# Patient Record
Sex: Female | Born: 1965 | Race: White | Hispanic: No | Marital: Married | Smoking: Never smoker
Health system: Southern US, Community
[De-identification: ages and names within clinical notes are randomized; demographics above are authoritative.]

## PROBLEM LIST (undated history)

## (undated) DIAGNOSIS — G43909 Migraine, unspecified, not intractable, without status migrainosus: Secondary | ICD-10-CM

## (undated) DIAGNOSIS — C801 Malignant (primary) neoplasm, unspecified: Secondary | ICD-10-CM

## (undated) DIAGNOSIS — Z923 Personal history of irradiation: Secondary | ICD-10-CM

## (undated) DIAGNOSIS — I341 Nonrheumatic mitral (valve) prolapse: Secondary | ICD-10-CM

## (undated) DIAGNOSIS — J45909 Unspecified asthma, uncomplicated: Secondary | ICD-10-CM

## (undated) HISTORY — PX: OTHER SURGICAL HISTORY: SHX169

## (undated) HISTORY — DX: Nonrheumatic mitral (valve) prolapse: I34.1

## (undated) HISTORY — DX: Migraine, unspecified, not intractable, without status migrainosus: G43.909

## (undated) HISTORY — DX: Unspecified asthma, uncomplicated: J45.909

---

## 1998-07-14 ENCOUNTER — Other Ambulatory Visit: Admission: RE | Admit: 1998-07-14 | Discharge: 1998-07-14 | Payer: Self-pay | Admitting: Gynecology

## 1999-08-17 ENCOUNTER — Inpatient Hospital Stay (HOSPITAL_COMMUNITY): Admission: AD | Admit: 1999-08-17 | Discharge: 1999-08-19 | Payer: Self-pay | Admitting: Gynecology

## 1999-09-22 ENCOUNTER — Other Ambulatory Visit: Admission: RE | Admit: 1999-09-22 | Discharge: 1999-09-22 | Payer: Self-pay | Admitting: Obstetrics and Gynecology

## 2000-09-26 ENCOUNTER — Other Ambulatory Visit: Admission: RE | Admit: 2000-09-26 | Discharge: 2000-09-26 | Payer: Self-pay | Admitting: Obstetrics and Gynecology

## 2000-12-18 ENCOUNTER — Ambulatory Visit (HOSPITAL_COMMUNITY): Admission: RE | Admit: 2000-12-18 | Discharge: 2000-12-18 | Payer: Self-pay | Admitting: Gastroenterology

## 2001-10-11 ENCOUNTER — Other Ambulatory Visit: Admission: RE | Admit: 2001-10-11 | Discharge: 2001-10-11 | Payer: Self-pay | Admitting: Obstetrics and Gynecology

## 2003-07-06 ENCOUNTER — Other Ambulatory Visit: Admission: RE | Admit: 2003-07-06 | Discharge: 2003-07-06 | Payer: Self-pay | Admitting: Obstetrics and Gynecology

## 2003-12-05 HISTORY — PX: DILATION AND CURETTAGE OF UTERUS: SHX78

## 2004-03-09 ENCOUNTER — Ambulatory Visit (HOSPITAL_COMMUNITY): Admission: RE | Admit: 2004-03-09 | Discharge: 2004-03-09 | Payer: Self-pay | Admitting: Obstetrics and Gynecology

## 2004-12-30 ENCOUNTER — Other Ambulatory Visit: Admission: RE | Admit: 2004-12-30 | Discharge: 2004-12-30 | Payer: Self-pay | Admitting: Obstetrics and Gynecology

## 2012-02-23 ENCOUNTER — Other Ambulatory Visit: Payer: Self-pay | Admitting: Obstetrics and Gynecology

## 2012-04-03 ENCOUNTER — Other Ambulatory Visit: Payer: Self-pay | Admitting: Obstetrics and Gynecology

## 2013-01-06 ENCOUNTER — Other Ambulatory Visit: Payer: Self-pay | Admitting: Obstetrics and Gynecology

## 2013-11-21 ENCOUNTER — Encounter (INDEPENDENT_AMBULATORY_CARE_PROVIDER_SITE_OTHER): Payer: Self-pay

## 2013-11-21 ENCOUNTER — Encounter: Payer: Self-pay | Admitting: Interventional Cardiology

## 2013-11-21 ENCOUNTER — Ambulatory Visit (INDEPENDENT_AMBULATORY_CARE_PROVIDER_SITE_OTHER): Payer: BC Managed Care – PPO | Admitting: Interventional Cardiology

## 2013-11-21 VITALS — BP 158/89 | HR 74 | Ht 67.0 in | Wt 122.1 lb

## 2013-11-21 DIAGNOSIS — I059 Rheumatic mitral valve disease, unspecified: Secondary | ICD-10-CM | POA: Insufficient documentation

## 2013-11-21 DIAGNOSIS — Z13220 Encounter for screening for lipoid disorders: Secondary | ICD-10-CM

## 2013-11-21 DIAGNOSIS — R002 Palpitations: Secondary | ICD-10-CM

## 2013-11-21 NOTE — Progress Notes (Signed)
Patient ID: Susan Mueller, female   DOB: Jun 10, 1966, 47 y.o.   MRN: [id number]     Patient ID: Susan Mueller MRN: [id number] DOB/AGE: November 11, 1966 47 y.o.   Referring Physician Dr. Vincente Poli   Reason for Consultation MVP, elevated BP readings  HPI: 47 y/o who was diagnosed with MVP diagnosed about 20 years ago.  Echo at that time was ok.  No SBE prophylaxis.  She has had some intermittent elevated BP readings.  Systolics up to the 150 mm Hg range.  Most readings are below 140 systolic.  She is perimenopausal.  She is very active.  Her job is physical.  She exercises several times a week, but has decreased in the past few months.  She runs as well when the weather is good.    She eats well typically.  She had eaten salty foods prior to some of her BP readings being elevated.    She has had some palpitations as well.  She has had them for years.  While younger, it was at rest.  She feels some skipped beats.  THey have lasted up to 30 seconds.  She feels she is high strung and that is when BP is up.  She has stopped coffee over the past 2 weeks.  She drinks sweet tea for lunch.    Current Outpatient Prescriptions  Medication Sig Dispense Refill  . PROAIR HFA 108 (90 BASE) MCG/ACT inhaler       . PULMICORT FLEXHALER 180 MCG/ACT inhaler        No current facility-administered medications for this visit.   No past medical history on file.  History reviewed. No pertinent family history.  History   Social History  . Marital Status: Married    Spouse Name: N/A    Number of Children: N/A  . Years of Education: N/A   Occupational History  . Not on file.   Social History Main Topics  . Smoking status: Never Smoker   . Smokeless tobacco: Not on file  . Alcohol Use: Yes  . Drug Use: No  . Sexual Activity: Not on file   Other Topics Concern  . Not on file   Social History Narrative  . No narrative on file    No past surgical history on file.    (Not in a hospital  admission)  Review of systems complete and found to be negative unless listed above .  No nausea, vomiting.  No fever chills, No focal weakness,  No palpitations.  Physical Exam: Filed Vitals:   11/21/13 1352  BP: 158/89  Pulse: 74    Weight: 122 lb 1.9 oz (55.393 kg)  Physical exam:  [place]/AT EOMI No JVD, No carotid bruit RRR S1S2  No wheezing Soft. NT, nondistended No edema. No focal motor or sensory deficits Normal affect  Labs:   No results found for this basename: WBC, HGB, HCT, MCV, PLT   No results found for this basename: NA, K, CL, CO2, BUN, CREATININE, CALCIUM, LABALBU, PROT, BILITOT, ALKPHOS, ALT, AST, GLUCOSE,  in the last 168 hours No results found for this basename: CKTOTAL, CKMB, CKMBINDEX, TROPONINI    No results found for this basename: CHOL   No results found for this basename: HDL   No results found for this basename: LDLCALC   No results found for this basename: TRIG   No results found for this basename: CHOLHDL   No results found for this basename: LDLDIRECT      Radiology: EKG:  ASSESSMENT AND PLAN:  MVP:  Plan for echocardiogram. No obvious heart failure symptoms.  Mitral valve prolapse diagnosed many years ago. Palpitations: No syncope. Her symptoms are improving. She'll keep an eye on her symptoms. If things get worse, she will let us know. We could consider a monitor at that time.  Signed:   Fredric Mare, MD, [facility] 11/21/2013, 2:12 PM

## 2013-11-21 NOTE — Patient Instructions (Addendum)
Your physician has requested that you have an echocardiogram. Echocardiography is a painless test that uses sound waves to create images of your heart. It provides your doctor with information about the size and shape of your heart and how well your heart's chambers and valves are working. This procedure takes approximately one hour. There are no restrictions for this procedure.  Your physician recommends that you schedule a follow-up appointment as needed.   Your physician recommends that you return for lab work on 11/24/13 for fasting lipid panel.

## 2013-11-24 ENCOUNTER — Other Ambulatory Visit (INDEPENDENT_AMBULATORY_CARE_PROVIDER_SITE_OTHER): Payer: BC Managed Care – PPO

## 2013-11-24 DIAGNOSIS — Z13220 Encounter for screening for lipoid disorders: Secondary | ICD-10-CM

## 2013-11-24 LAB — LIPID PANEL
HDL: 75.6 mg/dL (ref >39.00)
LDL Cholesterol: 81 mg/dL (ref 0–99)
Total CHOL/HDL Ratio: 2
Triglycerides: 42 mg/dL (ref 0.0–149.0)
VLDL: 8.4 mg/dL (ref 0.0–40.0)

## 2013-11-28 ENCOUNTER — Encounter: Payer: Self-pay | Admitting: Cardiology

## 2013-11-28 ENCOUNTER — Ambulatory Visit (HOSPITAL_COMMUNITY): Payer: BC Managed Care – PPO | Attending: Cardiology | Admitting: Radiology

## 2013-11-28 DIAGNOSIS — I059 Rheumatic mitral valve disease, unspecified: Secondary | ICD-10-CM

## 2013-11-28 DIAGNOSIS — I10 Essential (primary) hypertension: Secondary | ICD-10-CM | POA: Insufficient documentation

## 2013-11-28 DIAGNOSIS — R002 Palpitations: Secondary | ICD-10-CM | POA: Insufficient documentation

## 2013-11-28 DIAGNOSIS — I079 Rheumatic tricuspid valve disease, unspecified: Secondary | ICD-10-CM | POA: Insufficient documentation

## 2013-11-28 NOTE — Progress Notes (Signed)
Echocardiogram performed by Matt LeBeau  

## 2013-12-01 ENCOUNTER — Telehealth: Payer: Self-pay | Admitting: Interventional Cardiology

## 2013-12-01 NOTE — Telephone Encounter (Signed)
Follow Up  ° °Pt returned the call  °

## 2013-12-01 NOTE — Telephone Encounter (Signed)
Returned pt's call.

## 2014-01-26 ENCOUNTER — Other Ambulatory Visit: Payer: Self-pay | Admitting: Obstetrics and Gynecology

## 2015-08-24 DIAGNOSIS — H1010 Acute atopic conjunctivitis, unspecified eye: Secondary | ICD-10-CM | POA: Insufficient documentation

## 2015-08-24 DIAGNOSIS — J309 Allergic rhinitis, unspecified: Principal | ICD-10-CM

## 2015-08-24 DIAGNOSIS — J45909 Unspecified asthma, uncomplicated: Secondary | ICD-10-CM | POA: Insufficient documentation

## 2016-09-04 ENCOUNTER — Ambulatory Visit (INDEPENDENT_AMBULATORY_CARE_PROVIDER_SITE_OTHER): Payer: BC Managed Care – PPO | Admitting: Allergy and Immunology

## 2016-09-04 ENCOUNTER — Encounter: Payer: Self-pay | Admitting: Allergy and Immunology

## 2016-09-04 VITALS — BP 118/70 | HR 60 | Resp 16 | Ht 65.24 in | Wt 125.4 lb

## 2016-09-04 DIAGNOSIS — H1010 Acute atopic conjunctivitis, unspecified eye: Secondary | ICD-10-CM | POA: Diagnosis not present

## 2016-09-04 DIAGNOSIS — J4530 Mild persistent asthma, uncomplicated: Secondary | ICD-10-CM | POA: Diagnosis not present

## 2016-09-04 DIAGNOSIS — J309 Allergic rhinitis, unspecified: Secondary | ICD-10-CM | POA: Diagnosis not present

## 2016-09-04 MED ORDER — ALBUTEROL SULFATE HFA 108 (90 BASE) MCG/ACT IN AERS: 2.0000 | INHALATION_SPRAY | RESPIRATORY_TRACT | 0 refills | Status: AC | PRN

## 2016-09-04 MED ORDER — BUDESONIDE 180 MCG/ACT IN AEPB: 2.0000 | INHALATION_SPRAY | Freq: Two times a day (BID) | RESPIRATORY_TRACT | 3 refills | Status: AC

## 2016-09-04 NOTE — Patient Instructions (Addendum)
  1. Continue Pulmicort 180 - 2 inhalations one or 2 times per day depending on disease activity  2. Continue ProAir HFA 2 puffs every 4-6 hours as needed  3. Continue over-the-counter antihistamine if needed  4. Obtain fall flu vaccine  5. Return to clinic in 1 year or earlier if problem

## 2016-09-04 NOTE — Progress Notes (Signed)
Follow-up Note  Referring Provider: No ref. provider found Primary Provider: Cyril Mourning, MD Date of Office Visit: 09/04/2016  Subjective:   Susan Mueller (DOB: 09-Aug-1966) is a 50 y.o. female who returns to the [facility] on 09/04/2016 in re-evaluation of the following:  HPI: Susan Mueller presents this clinic in evaluation of her asthma and allergic rhinoconjunctivitis. I have not seen her in his clinic since 08/30/2015.  During the interval she has done relatively well. She has not required a systemic steroid to treat an exacerbation. However, she did develop a viral upper respiratory tract infection associated with fever about a month ago and although her upper airway issue has basically resolved she still has a little bit of a lingering cough and chest congestion although certainly every week this is getting better. She did need to use her short-acting bronchodilator during this timeframe but her requirement is now back to 0. She did increase her Pulmicort to twice a day during this timeframe where as she normally uses Pulmicort one time per day.  She is not required a systemic steroids nor has she required a antibiotic to treat her upper and lower airway issues.     Medication List      albuterol 108 (90 Base) MCG/ACT inhaler Commonly known as:  PROAIR HFA Inhale 2 puffs into the lungs every 4 (four) hours as needed for wheezing or shortness of breath.   budesonide 180 MCG/ACT inhaler Commonly known as:  PULMICORT FLEXHALER Inhale 2 puffs into the lungs 2 (two) times daily.   PRESCRIPTION MEDICATION   PRESCRIPTION MEDICATION 1 application daily.       Past Medical History:  Diagnosis Date  . Asthma     Past Surgical History:  Procedure Laterality Date  . arm surgery Left 50 yrs old   broken elbow    Allergies  Allergen Reactions  . Avelox [Moxifloxacin Hcl In Nacl] Other (See Comments)    Numbness in arm  . Codeine   . Septra  [Sulfamethoxazole-Trimethoprim]     Review of systems negative except as noted in HPI / PMHx or noted below:  Review of Systems  Constitutional: Negative.   HENT: Negative.   Eyes: Negative.   Respiratory: Negative.   Cardiovascular: Negative.   Gastrointestinal: Negative.   Genitourinary: Negative.   Musculoskeletal: Negative.   Skin: Negative.   Neurological: Negative.   Endo/Heme/Allergies: Negative.   Psychiatric/Behavioral: Negative.      Objective:   Vitals:   09/04/16 1716  BP: 118/70  Pulse: 60  Resp: 16   Height: 5' 5.24" (165.7 cm)  Weight: 125 lb 6.4 oz (56.9 kg)   Physical Exam  Constitutional: She is well-developed, well-nourished, and in no distress.  HENT:  Head: Normocephalic.  Right Ear: Tympanic membrane, external ear and ear canal normal.  Left Ear: Tympanic membrane, external ear and ear canal normal.  Nose: Nose normal. No mucosal edema or rhinorrhea.  Mouth/Throat: Uvula is midline, oropharynx is clear and moist and mucous membranes are normal. No oropharyngeal exudate.  Eyes: Conjunctivae are normal.  Neck: Trachea normal. No tracheal tenderness present. No tracheal deviation present. No thyromegaly present.  Cardiovascular: Normal rate, regular rhythm, S1 normal, S2 normal and normal heart sounds.   No murmur heard. Pulmonary/Chest: Breath sounds normal. No stridor. No respiratory distress. She has no wheezes. She has no rales.  Musculoskeletal: She exhibits no edema.  Lymphadenopathy:       Head (right side): No tonsillar adenopathy present.  Head (left side): No tonsillar adenopathy present.    She has no cervical adenopathy.  Neurological: She is alert. Gait normal.  Skin: No rash noted. She is not diaphoretic. No erythema. Nails show no clubbing.  Psychiatric: Mood and affect normal.    Diagnostics:    Spirometry was performed and demonstrated an FEV1 of 2.70 at 92 % of predicted.   Assessment and Plan:   1. Mild  persistent asthma without complication   2. Allergic rhinoconjunctivitis     1. Continue Pulmicort 180 - 2 inhalations one or 2 times per day depending on disease activity  2. Continue ProAir HFA 2 puffs every 4-6 hours as needed  3. Continue over-the-counter antihistamine if needed  4. Obtain fall flu vaccine  5. Return to clinic in 1 year or earlier if problem  Susan Mueller appears to be doing relatively well although certainly she appears to have contracted a viral respiratory tract infection that is slowly improving. She is a very good understanding of how her medications work and when it is appropriate to change the dose of Pulmicort. I will see her back in this clinic in 1 year or earlier if there is a problem.  Allena Katz, MD [place]

## 2019-01-06 ENCOUNTER — Other Ambulatory Visit: Payer: Self-pay | Admitting: Obstetrics and Gynecology

## 2019-01-06 DIAGNOSIS — R928 Other abnormal and inconclusive findings on diagnostic imaging of breast: Secondary | ICD-10-CM

## 2019-01-10 ENCOUNTER — Ambulatory Visit
Admission: RE | Admit: 2019-01-10 | Discharge: 2019-01-10 | Disposition: A | Discharge status: Home / Self Care | Payer: BC Managed Care – PPO | Source: Ambulatory Visit | Attending: Obstetrics and Gynecology | Admitting: Obstetrics and Gynecology

## 2019-01-10 ENCOUNTER — Other Ambulatory Visit: Payer: Self-pay | Admitting: Obstetrics and Gynecology

## 2019-01-10 DIAGNOSIS — R928 Other abnormal and inconclusive findings on diagnostic imaging of breast: Secondary | ICD-10-CM

## 2019-01-10 DIAGNOSIS — R921 Mammographic calcification found on diagnostic imaging of breast: Secondary | ICD-10-CM

## 2019-01-15 ENCOUNTER — Ambulatory Visit
Admission: RE | Admit: 2019-01-15 | Discharge: 2019-01-15 | Disposition: A | Discharge status: Home / Self Care | Payer: BC Managed Care – PPO | Source: Ambulatory Visit | Attending: Obstetrics and Gynecology | Admitting: Obstetrics and Gynecology

## 2019-01-15 ENCOUNTER — Other Ambulatory Visit: Payer: Self-pay | Admitting: Obstetrics and Gynecology

## 2019-01-15 DIAGNOSIS — R921 Mammographic calcification found on diagnostic imaging of breast: Secondary | ICD-10-CM

## 2019-01-16 ENCOUNTER — Other Ambulatory Visit: Payer: Self-pay | Admitting: Obstetrics and Gynecology

## 2019-01-16 DIAGNOSIS — C50912 Malignant neoplasm of unspecified site of left female breast: Secondary | ICD-10-CM

## 2019-01-19 ENCOUNTER — Encounter: Payer: Self-pay | Admitting: General Surgery

## 2019-01-20 ENCOUNTER — Encounter: Payer: Self-pay | Admitting: *Deleted

## 2019-01-20 DIAGNOSIS — D0512 Intraductal carcinoma in situ of left breast: Secondary | ICD-10-CM | POA: Insufficient documentation

## 2019-01-21 ENCOUNTER — Ambulatory Visit
Admission: RE | Admit: 2019-01-21 | Discharge: 2019-01-21 | Disposition: A | Discharge status: Home / Self Care | Payer: BC Managed Care – PPO | Source: Ambulatory Visit | Attending: Obstetrics and Gynecology | Admitting: Obstetrics and Gynecology

## 2019-01-21 DIAGNOSIS — C50912 Malignant neoplasm of unspecified site of left female breast: Secondary | ICD-10-CM

## 2019-01-21 NOTE — Progress Notes (Signed)
[facility] CONSULT NOTE  Patient Care Team: Dian Queen, MD as PCP - General (Obstetrics and Gynecology) Fanny Skates, MD as Consulting Physician (General Surgery) Nicholas Lose, MD as Consulting Physician (Hematology and Oncology) Eppie Gibson, MD as Attending Physician (Radiation Oncology) Rockwell Germany, RN as Oncology Nurse Navigator Mauro Kaufmann, RN as Oncology Nurse Navigator  CHIEF COMPLAINTS/PURPOSE OF CONSULTATION: Newly diagnosed breast cancer  HISTORY OF PRESENTING ILLNESS:  Susan Mueller 53 y.o. female is here because of recent diagnosis of DCIS of the left breast. The cancer was detected on a routine diagnostic mammogram on 01/10/19 which showed calcifications in the left breast. A biopsy on 01/15/19 showed the 0.8cm cancer in the upper outer quadrant to be high grade DCIS with calcifications, ER 80%, PR negative. A biopsy done on 01/21/19 showed no evidence of malignancy in the upper inner quadrant of the left breast.   She presents to the clinic today with her husband. She denies any family history of breast cancer. She has taken estrogen and progesterone for 2 years for menopausal symptoms and slightly elevated high blood pressure. She also uses vaginal estrogen cream for vaginal dryness. She works in [place], [place] and is interested in radiation closer to her work there.   I reviewed her records extensively and collaborated the history with the patient.  SUMMARY OF ONCOLOGIC HISTORY:   Ductal carcinoma in situ (DCIS) of left breast   01/15/2019 Initial Diagnosis    Screening detected left breast calcifications UOQ 8 mm: Biopsy revealed high-grade DCIS with calcifications and necrosis ER 80% strong positive, PR 0%; second group of calcifications medially?  Oil cyst biopsy pending; Tis NX stage 0     MEDICAL HISTORY:  Past Medical History:  Diagnosis Date  . Asthma   . Migraines   . Mitral valve prolapse     SURGICAL HISTORY: Past  Surgical History:  Procedure Laterality Date  . arm surgery Left 53 yrs old   broken elbow    SOCIAL HISTORY: Social History   Socioeconomic History  . Marital status: Married    Spouse name: Not on file  . Number of children: Not on file  . Years of education: Not on file  . Highest education level: Not on file  Occupational History  . Not on file  Social Needs  . Financial resource strain: Not on file  . Food insecurity:    Worry: Not on file    Inability: Not on file  . Transportation needs:    Medical: Not on file    Non-medical: Not on file  Tobacco Use  . Smoking status: Never Smoker  Substance and Sexual Activity  . Alcohol use: Yes  . Drug use: No  . Sexual activity: Not on file  Lifestyle  . Physical activity:    Days per week: Not on file    Minutes per session: Not on file  . Stress: Not on file  Relationships  . Social connections:    Talks on phone: Not on file    Gets together: Not on file    Attends religious service: Not on file    Active member of club or organization: Not on file    Attends meetings of clubs or organizations: Not on file    Relationship status: Not on file  . Intimate partner violence:    Fear of current or ex partner: Not on file    Emotionally abused: Not on file    Physically abused:  Not on file    Forced sexual activity: Not on file  Other Topics Concern  . Not on file  Social History Narrative  . Not on file    FAMILY HISTORY: Family History  Problem Relation Age of Onset  . Hypercholesterolemia Sister        elevated triglycerides    ALLERGIES:  is allergic to avelox [moxifloxacin hcl in nacl]; codeine; and septra [sulfamethoxazole-trimethoprim].  MEDICATIONS:  Current Outpatient Medications  Medication Sig Dispense Refill  . albuterol (PROAIR HFA) 108 (90 Base) MCG/ACT inhaler Inhale 2 puffs into the lungs every 4 (four) hours as needed for wheezing or shortness of breath. 1 Inhaler 0  . Multiple  Vitamins-Minerals (MULTIVITAMIN WITH MINERALS) tablet Take 1 tablet by mouth daily.    Marland Kitchen PRESCRIPTION MEDICATION 1 application daily.    . budesonide (PULMICORT FLEXHALER) 180 MCG/ACT inhaler Inhale 2 puffs into the lungs 2 (two) times daily. (Patient not taking: Reported on 01/22/2019) 1 Inhaler 3  . PRESCRIPTION MEDICATION      No current facility-administered medications for this visit.     REVIEW OF SYSTEMS:   Constitutional: Denies fevers, chills or abnormal night sweats Eyes: Denies blurriness of vision, double vision or watery eyes Ears, nose, mouth, throat, and face: Denies mucositis or sore throat Respiratory: Denies cough, dyspnea or wheezes Cardiovascular: Denies palpitation, chest discomfort or lower extremity swelling Gastrointestinal:  Denies nausea, heartburn or change in bowel habits Skin: Denies abnormal skin rashes Lymphatics: Denies new lymphadenopathy or easy bruising Neurological:Denies numbness, tingling or new weaknesses Behavioral/Psych: Mood is stable, no new changes  Breast: Denies any palpable lumps or discharge All other systems were reviewed with the patient and are negative.  PHYSICAL EXAMINATION: ECOG PERFORMANCE STATUS: 1 - Symptomatic but completely ambulatory  Vitals:   01/22/19 1239  BP: (!) 153/85  Pulse: 77  Resp: 20  Temp: 98.7 F (37.1 C)  SpO2: 100%   Filed Weights   01/22/19 1239  Weight: 127 lb 9.6 oz (57.9 kg)    GENERAL:alert, no distress and comfortable SKIN: skin color, texture, turgor are normal, no rashes or significant lesions EYES: normal, conjunctiva are pink and non-injected, sclera clear OROPHARYNX:no exudate, no erythema and lips, buccal mucosa, and tongue normal  NECK: supple, thyroid normal size, non-tender, without nodularity LYMPH:  no palpable lymphadenopathy in the cervical, axillary or inguinal LUNGS: clear to auscultation and percussion with normal breathing effort HEART: regular rate & rhythm and no murmurs  and no lower extremity edema ABDOMEN:abdomen soft, non-tender and normal bowel sounds Musculoskeletal:no cyanosis of digits and no clubbing  PSYCH: alert & oriented x 3 with fluent speech NEURO: no focal motor/sensory deficits  LABORATORY DATA:  I have reviewed the data as listed Lab Results  Component Value Date   WBC 6.4 01/22/2019   HGB 13.7 01/22/2019   HCT 39.4 01/22/2019   MCV 92.1 01/22/2019   PLT 208 01/22/2019   Lab Results  Component Value Date   NA 143 01/22/2019   K 3.8 01/22/2019   CL 105 01/22/2019   CO2 30 01/22/2019    RADIOGRAPHIC STUDIES: I have personally reviewed the radiological reports and agreed with the findings in the report.  ASSESSMENT AND PLAN:  Ductal carcinoma in situ (DCIS) of left breast 01/15/2019:Screening detected left breast calcifications UOQ 8 mm: Biopsy revealed high-grade DCIS with calcifications and necrosis ER 80% strong positive, PR 0%; second group of calcifications medially?  Oil cyst biopsy pending; Tis NX stage 0  Pathology review: I  discussed with the patient the difference between DCIS and invasive breast cancer. It is considered a precancerous lesion. DCIS is classified as a 0. It is generally detected through mammograms as calcifications. We discussed the significance of grades and its impact on prognosis. We also discussed the importance of ER and PR receptors and their implications to adjuvant treatment options. Prognosis of DCIS dependence on grade, comedo necrosis. It is anticipated that if not treated, 20-30% of DCIS can develop into invasive breast cancer.  Recommendation: 1. Breast conserving surgery 2. Followed by adjuvant radiation therapy 3. Followed by antiestrogen therapy with tamoxifen 5 years  Tamoxifen counseling: We discussed the risks and benefits of tamoxifen. These include but not limited to insomnia, hot flashes, mood changes, vaginal dryness, and weight gain. Although rare, serious side effects including  endometrial cancer, risk of blood clots were also discussed. We strongly believe that the benefits far outweigh the risks. Patient understands these risks and consented to starting treatment. Planned treatment duration is 5 years.  Return to clinic after surgery to discuss the final pathology report and come up with an adjuvant treatment plan.     All questions were answered. The patient knows to call the clinic with any problems, questions or concerns.   Nicholas Lose, MD 01/22/2019   I, Cloyde Reams Dorshimer, am acting as scribe for Nicholas Lose, MD.  I have reviewed the above documentation for accuracy and completeness, and I agree with the above.

## 2019-01-22 ENCOUNTER — Inpatient Hospital Stay: Payer: BC Managed Care – PPO

## 2019-01-22 ENCOUNTER — Encounter: Payer: Self-pay | Admitting: Hematology and Oncology

## 2019-01-22 ENCOUNTER — Other Ambulatory Visit: Payer: Self-pay | Admitting: General Surgery

## 2019-01-22 ENCOUNTER — Ambulatory Visit: Payer: BC Managed Care – PPO | Admitting: Physical Therapy

## 2019-01-22 ENCOUNTER — Encounter: Payer: Self-pay | Admitting: Radiation Oncology

## 2019-01-22 ENCOUNTER — Inpatient Hospital Stay: Payer: BC Managed Care – PPO | Attending: Hematology and Oncology | Admitting: Hematology and Oncology

## 2019-01-22 ENCOUNTER — Other Ambulatory Visit: Payer: Self-pay | Admitting: Genetics

## 2019-01-22 ENCOUNTER — Ambulatory Visit
Admission: RE | Admit: 2019-01-22 | Discharge: 2019-01-22 | Disposition: A | Discharge status: Home / Self Care | Payer: BC Managed Care – PPO | Source: Ambulatory Visit | Attending: Radiation Oncology | Admitting: Radiation Oncology

## 2019-01-22 DIAGNOSIS — J45909 Unspecified asthma, uncomplicated: Secondary | ICD-10-CM | POA: Diagnosis not present

## 2019-01-22 DIAGNOSIS — D0512 Intraductal carcinoma in situ of left breast: Secondary | ICD-10-CM

## 2019-01-22 DIAGNOSIS — I341 Nonrheumatic mitral (valve) prolapse: Secondary | ICD-10-CM | POA: Diagnosis not present

## 2019-01-22 DIAGNOSIS — C50412 Malignant neoplasm of upper-outer quadrant of left female breast: Secondary | ICD-10-CM

## 2019-01-22 DIAGNOSIS — Z79899 Other long term (current) drug therapy: Secondary | ICD-10-CM | POA: Diagnosis not present

## 2019-01-22 DIAGNOSIS — Z170 Estrogen receptor positive status [ER+]: Secondary | ICD-10-CM | POA: Diagnosis not present

## 2019-01-22 LAB — CBC WITH DIFFERENTIAL (CANCER CENTER ONLY)
Abs Immature Granulocytes: 0.01 10*3/uL (ref 0.00–0.07)
Basophils Absolute: 0 10*3/uL (ref 0.0–0.1)
Basophils Relative: 1 %
EOS ABS: 0 10*3/uL (ref 0.0–0.5)
Eosinophils Relative: 1 %
HCT: 39.4 % (ref 36.0–46.0)
Hemoglobin: 13.7 g/dL (ref 12.0–15.0)
Immature Granulocytes: 0 %
Lymphocytes Relative: 35 %
Lymphs Abs: 2.2 10*3/uL (ref 0.7–4.0)
MCH: 32 pg (ref 26.0–34.0)
MCHC: 34.8 g/dL (ref 30.0–36.0)
MCV: 92.1 fL (ref 80.0–100.0)
Monocytes Absolute: 0.3 10*3/uL (ref 0.1–1.0)
Monocytes Relative: 5 %
Neutro Abs: 3.8 10*3/uL (ref 1.7–7.7)
Neutrophils Relative %: 58 %
Platelet Count: 208 10*3/uL (ref 150–400)
RBC: 4.28 MIL/uL (ref 3.87–5.11)
RDW: 11.6 % (ref 11.5–15.5)
WBC Count: 6.4 10*3/uL (ref 4.0–10.5)
nRBC: 0 % (ref 0.0–0.2)

## 2019-01-22 LAB — CMP (CANCER CENTER ONLY)
ALK PHOS: 48 U/L (ref 38–126)
ALT: 14 U/L (ref 0–44)
AST: 18 U/L (ref 15–41)
Albumin: 4.5 g/dL (ref 3.5–5.0)
Anion gap: 8 (ref 5–15)
BUN: 12 mg/dL (ref 6–20)
CALCIUM: 9.4 mg/dL (ref 8.9–10.3)
CO2: 30 mmol/L (ref 22–32)
Chloride: 105 mmol/L (ref 98–111)
Creatinine: 0.86 mg/dL (ref 0.44–1.00)
GFR, Est AFR Am: >60 mL/min (ref >60)
GFR, Estimated: >60 mL/min (ref >60)
Glucose, Bld: 133 mg/dL — ABNORMAL HIGH (ref 70–99)
Potassium: 3.8 mmol/L (ref 3.5–5.1)
Sodium: 143 mmol/L (ref 135–145)
Total Bilirubin: 0.5 mg/dL (ref 0.3–1.2)
Total Protein: 7.1 g/dL (ref 6.5–8.1)

## 2019-01-22 NOTE — Assessment & Plan Note (Signed)
01/15/2019:Screening detected left breast calcifications UOQ 8 mm: Biopsy revealed high-grade DCIS with calcifications and necrosis ER 80% strong positive, PR 0%; second group of calcifications medially?  Oil cyst biopsy pending; Tis NX stage 0  Pathology review: I discussed with the patient the difference between DCIS and invasive breast cancer. It is considered a precancerous lesion. DCIS is classified as a 0. It is generally detected through mammograms as calcifications. We discussed the significance of grades and its impact on prognosis. We also discussed the importance of ER and PR receptors and their implications to adjuvant treatment options. Prognosis of DCIS dependence on grade, comedo necrosis. It is anticipated that if not treated, 20-30% of DCIS can develop into invasive breast cancer.  Recommendation: 1. Breast conserving surgery 2. Followed by adjuvant radiation therapy 3. Followed by antiestrogen therapy with tamoxifen 5 years  Tamoxifen counseling: We discussed the risks and benefits of tamoxifen. These include but not limited to insomnia, hot flashes, mood changes, vaginal dryness, and weight gain. Although rare, serious side effects including endometrial cancer, risk of blood clots were also discussed. We strongly believe that the benefits far outweigh the risks. Patient understands these risks and consented to starting treatment. Planned treatment duration is 5 years.  Return to clinic after surgery to discuss the final pathology report and come up with an adjuvant treatment plan.

## 2019-01-22 NOTE — Progress Notes (Signed)
Radiation Oncology         [phone]) [phone] ________________________________  Initial outpatient Consultation  Name: DIANELYS SCINTO MRN: [id number]  Date: 01/22/2019  DOB: 08/29/1966  KG:YJEHUD, Sharyn Lull, MD  Fanny Skates, MD   REFERRING PHYSICIAN: Fanny Skates, MD  DIAGNOSIS:    ICD-10-CM   1. Ductal carcinoma in situ (DCIS) of left breast D05.12      Cancer Staging Ductal carcinoma in situ (DCIS) of left breast Staging form: Breast, AJCC 8th Edition - Clinical stage from 01/22/2019: Stage 0 (cTis (DCIS), cN0, cM0, ER+, PR-, HER2: Not Assessed) - Unsigned   CHIEF COMPLAINT: Here to discuss management of left breast DCIS  HISTORY OF PRESENT ILLNESS::Sherryl C Kasal is a 53 y.o. female who presented with breast abnormality on the following imaging: Mammography which showed an upper outer quadrant 8 mm region of left breast calcifications.  There is also an area of calcifications more medially consistent with an oil cyst.  Symptoms, if any, at that time, were: None.  Biopsy of calcifications left upper outer quadrant revealed high-grade DCIS with necrosis, ER positive PR negative.  Second biopsy at the area of the presumed oil cyst revealed fibrocystic change and fat necrosis with calcifications, no malignancy  The patient is in her usual state of health otherwise.  She was just instructed to stop her hormonal supplementation.  She does report baseline hearing loss and ringing in her ears as well as palpitations and mitral valve prolapse.  She has a history of a pre-cancerous lesion removed from her skin as well as she reports headaches.  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  PREVIOUS RADIATION THERAPY: No  PAST MEDICAL HISTORY:  has a past medical history of Asthma, Migraines, and Mitral valve prolapse.    PAST SURGICAL HISTORY: Past Surgical History:  Procedure Laterality Date  . arm surgery Left 53 yrs old   broken elbow    FAMILY  HISTORY: family history includes Hypercholesterolemia in her sister.  SOCIAL HISTORY:  reports that she has never smoked. She does not have any smokeless tobacco history on file. She reports current alcohol use. She reports that she does not use drugs.  ALLERGIES: Avelox [moxifloxacin hcl in nacl]; Codeine; and Septra [sulfamethoxazole-trimethoprim]  MEDICATIONS:  Current Outpatient Medications  Medication Sig Dispense Refill  . albuterol (PROAIR HFA) 108 (90 Base) MCG/ACT inhaler Inhale 2 puffs into the lungs every 4 (four) hours as needed for wheezing or shortness of breath. 1 Inhaler 0  . budesonide (PULMICORT FLEXHALER) 180 MCG/ACT inhaler Inhale 2 puffs into the lungs 2 (two) times daily. (Patient not taking: Reported on 01/22/2019) 1 Inhaler 3  . Multiple Vitamins-Minerals (MULTIVITAMIN WITH MINERALS) tablet Take 1 tablet by mouth daily.    Marland Kitchen PRESCRIPTION MEDICATION     . PRESCRIPTION MEDICATION 1 application daily.     No current facility-administered medications for this encounter.     REVIEW OF SYSTEMS: A 10+ POINT REVIEW OF SYSTEMS WAS OBTAINED including neurology, dermatology, psychiatry, cardiac, respiratory, lymph, extremities, GI, GU, Musculoskeletal, constitutional, breasts, reproductive, HEENT.  All pertinent positives are noted in the HPI.  All others are negative.   PHYSICAL EXAM:   Vitals with Age-Percentiles 01/22/2019  Length 149.7 cm  Systolic 026  Diastolic 85  Pulse 77  Respiration 20  Weight 57.879 kg  BMI 20.61  VISIT REPORT    General: Alert and oriented, in no acute distress HEENT: Head is normocephalic. Extraocular movements are intact. Oropharynx is clear. Neck:  Neck is supple, no palpable cervical or supraclavicular lymphadenopathy. Heart: Regular in rate and rhythm with no murmurs, rubs, or gallops. Chest: Clear to auscultation bilaterally, with no rhonchi, wheezes, or rales. Abdomen: Soft, nontender, nondistended, with no rigidity or  guarding. Extremities: No cyanosis or edema. Lymphatics: see Neck Exam Skin: No concerning lesions. Musculoskeletal: symmetric strength and muscle tone throughout. Neurologic: Cranial nerves II through XII are grossly intact. No obvious focalities. Speech is fluent. Coordination is intact. Psychiatric: Judgment and insight are intact. Affect is appropriate. Breasts: There is hardening in the upper left breast related to recent biopsies.. No other palpable masses appreciated in the breasts or axillae bilaterally    ECOG = 0  0 - Asymptomatic (Fully active, able to carry on all predisease activities without restriction)  1 - Symptomatic but completely ambulatory (Restricted in physically strenuous activity but ambulatory and able to carry out work of a light or sedentary nature. For example, light housework, office work)  2 - Symptomatic, <50% in bed during the day (Ambulatory and capable of all self care but unable to carry out any work activities. Up and about more than 50% of waking hours)  3 - Symptomatic, >50% in bed, but not bedbound (Capable of only limited self-care, confined to bed or chair 50% or more of waking hours)  4 - Bedbound (Completely disabled. Cannot carry on any self-care. Totally confined to bed or chair)  5 - Death   Eustace Pen MM, Creech RH, Tormey DC, et al. [phone]). "Toxicity and response criteria of the [facility]". [place] Oncol. 5 (6): 649-55   LABORATORY DATA:  Lab Results  Component Value Date   WBC 6.4 01/22/2019   HGB 13.7 01/22/2019   HCT 39.4 01/22/2019   MCV 92.1 01/22/2019   PLT 208 01/22/2019   CMP     Component Value Date/Time   NA 143 01/22/2019 1144   K 3.8 01/22/2019 1144   CL 105 01/22/2019 1144   CO2 30 01/22/2019 1144   GLUCOSE 133 (H) 01/22/2019 1144   BUN 12 01/22/2019 1144   CREATININE 0.86 01/22/2019 1144   CALCIUM 9.4 01/22/2019 1144   PROT 7.1 01/22/2019 1144   ALBUMIN 4.5 01/22/2019 1144   AST 18  01/22/2019 1144   ALT 14 01/22/2019 1144   ALKPHOS 48 01/22/2019 1144   BILITOT 0.5 01/22/2019 1144   GFRNONAA >60 01/22/2019 1144   GFRAA >60 01/22/2019 1144         RADIOGRAPHY: Mm Digital Diagnostic Unilat L  Result Date: 01/10/2019 CLINICAL DATA:  Patient was called back for left breast calcifications EXAM: DIGITAL DIAGNOSTIC LEFT MAMMOGRAM COMPARISON:  Previous exam(s). ACR Breast Density Category c: The breast tissue is heterogeneously dense, which may obscure small masses. FINDINGS: Calcifications in the upper-outer left breast spanning 8 mm are primarily round and punctate. They represent a change since previous mammography. A second group of calcifications in the medial superior left breast are likely a developing oil cyst. IMPRESSION: Indeterminate new left breast calcifications in the upper outer quadrant. Probable developing oil cyst with rim calcifications in the medial superior left breast. RECOMMENDATION: Recommend stereotactic biopsy of the new left breast calcifications in the upper outer quadrant. If the biopsy is benign, recommend six-month follow-up of the probable developing oil cyst. If the biopsy require surgery, consider biopsying the calcifications thought to be a developing oil cysts for confirmation. I have discussed the findings and recommendations with the patient. Results were also provided in writing at  the conclusion of the visit. If applicable, a reminder letter will be sent to the patient regarding the next appointment. BI-RADS CATEGORY  4: Suspicious. Electronically Signed   By: Dorise Bullion III M.D   On: 01/10/2019 12:23   Mm Clip Placement Left  Result Date: 01/21/2019 CLINICAL DATA:  53 year old female status post stereotactic biopsy of left breast calcifications. EXAM: DIAGNOSTIC LEFT MAMMOGRAM POST STEREOTACTIC BIOPSY COMPARISON:  Previous exam(s). FINDINGS: Mammographic images were obtained following stereotactic guided biopsy of the left breast. An X shaped  clip is identified in the lower inner quadrant at middle depth. There is approximately 1.5 cm inferior migration of the clip relative to the biopsy site. An additional coarse calcification is seen adjacent to the biopsied location. A coil shaped clip in the upper outer quadrant denotes the site of the patient's biopsy-proven high-grade invasive ductal carcinoma in situ. IMPRESSION: Approximately 1.5 cm inferior migration of the X shaped clip relative to the site of biopsy calcifications. An additional course calcification at the site of biopsy can be used for localization purposes. Final Assessment: Post Procedure Mammograms for Marker Placement Electronically Signed   By: Kristopher Oppenheim M.D.   On: 01/21/2019 09:27   Mm Clip Placement Left  Result Date: 01/15/2019 CLINICAL DATA:  Status post stereotactic guided core biopsy of calcifications in the UPPER-OUTER QUADRANT of the LEFT breast. EXAM: DIAGNOSTIC LEFT MAMMOGRAM POST ULTRASOUND BIOPSY COMPARISON:  Previous exam(s). FINDINGS: Mammographic images were obtained following stereotactic guided biopsy of calcifications in the UPPER-OUTER QUADRANT of the LEFT breast. A coil shaped clip is identified in the UPPER-OUTER QUADRANT of the RIGHT breast, adjacent to residual calcifications. Other scattered benign calcifications in the LEFT breast are stable. IMPRESSION: Tissue marker clip in the expected location following biopsy. Final Assessment: Post Procedure Mammograms for Marker Placement Electronically Signed   By: Nolon Nations M.D.   On: 01/15/2019 14:54   Mm Lt Breast Bx W Loc Dev 1st Lesion Image Bx Spec Stereo Guide  Addendum Date: 01/22/2019   ADDENDUM REPORT: 01/22/2019 12:02 ADDENDUM: Pathology revealed FIBROCYSTIC CHANGE AND FAT NECROSIS WITH CALCIFICATIONS of the upper inner quadrant, middle. This was found to be concordant by Dr. Kristopher Oppenheim. Pathology results were discussed with the patient by telephone. The patient reported doing well after  the biopsy with tenderness at the site. Post biopsy instructions and care were reviewed and questions were answered. The patient was encouraged to call [facility] for any additional concerns. The patient has a recent diagnosis of left breast cancer and should follow her outlined treatment plan. The patient was referred to [facility] on January 22, 2019. Pathology results reported by Terie Purser, RN on 01/22/2019. Electronically Signed   By: Kristopher Oppenheim M.D.   On: 01/22/2019 12:02   Result Date: 01/22/2019 CLINICAL DATA:  53 year old female with biopsy-proven high-grade DCIS in a left breast presents for additional biopsy of indeterminate left breast calcifications in the upper inner quadrant. EXAM: LEFT BREAST STEREOTACTIC CORE NEEDLE BIOPSY COMPARISON:  Previous exams. FINDINGS: The patient and I discussed the procedure of stereotactic-guided biopsy including benefits and alternatives. We discussed the high likelihood of a successful procedure. We discussed the risks of the procedure including infection, bleeding, tissue injury, clip migration, and inadequate sampling. Informed written consent was given. The usual time out protocol was performed immediately prior to the procedure. Using sterile technique and 1% Lidocaine as local anesthetic, under stereotactic guidance,  a 9 gauge vacuum assisted device was used to perform core needle biopsy of calcifications in the upper inner quadrant of the left breast at middle depth using a superior approach. Specimen radiograph was performed showing calcifications in all specimens. Specimens with calcifications are identified for pathology. Lesion quadrant: Upper inner quadrant At the conclusion of the procedure, a X shaped tissue marker clip was deployed into the biopsy cavity. Follow-up 2-view mammogram was performed and dictated separately. IMPRESSION:  Stereotactic-guided biopsy of indeterminate left breast calcifications. No apparent complications. Electronically Signed: By: Kristopher Oppenheim M.D. On: 01/21/2019 09:26   Mm Lt Breast Bx W Loc Dev 1st Lesion Image Bx Spec Stereo Guide  Addendum Date: 01/20/2019   ADDENDUM REPORT: 01/17/2019 08:19 ADDENDUM: Pathology revealed HIGH GRADE DUCTAL CARCINOMA IN SITU WITH CALCIFICATIONS of the Left breast, outer, (coil clip). This was found to be concordant by Dr. Nolon Nations. Pathology results were discussed with the patient by telephone. The patient reported doing well after the biopsy with tenderness at the site. Post biopsy instructions and care were reviewed and questions were answered. The patient was encouraged to call [facility] for any additional concerns. The patient was referred to [facility] on January 22, 2019. The patient is scheduled for an additional Left breast stereotatic guided biopsy at [facility] on January 21, 2019. Pathology results reported by Terie Purser, RN on 01/17/2019. Electronically Signed   By: Nolon Nations M.D.   On: 01/17/2019 08:19   Result Date: 01/20/2019 CLINICAL DATA:  Status post stereotactic guided biopsy of calcifications in the UPPER-OUTER QUADRANT the LEFT. EXAM: LEFT BREAST STEREOTACTIC CORE NEEDLE BIOPSY COMPARISON:  Previous exams. FINDINGS: The patient and I discussed the procedure of stereotactic-guided biopsy including benefits and alternatives. We discussed the high likelihood of a successful procedure. We discussed the risks of the procedure including infection, bleeding, tissue injury, clip migration, and inadequate sampling. Informed written consent was given. The usual time out protocol was performed immediately prior to the procedure. Using sterile technique and 1% Lidocaine as local anesthetic, under stereotactic guidance, a 9 gauge vacuum  assisted device was used to perform core needle biopsy of calcifications in the UPPER-OUTER QUADRANT of the LEFT breast using a superior to inferior approach. Specimen radiograph was performed showing calcifications numerous ankle. Specimens with calcifications are identified for pathology. Lesion quadrant:   UPPER-OUTER QUADRANT LEFT breast At the conclusion of the procedure, a coil shaped tissue marker clip was deployed into the biopsy cavity. Follow-up 2-view mammogram was performed and dictated separately. IMPRESSION: Stereotactic-guided biopsy of LEFT breast calcifications. No apparent complications. Electronically Signed: By: Nolon Nations M.D. On: 01/15/2019 14:53      IMPRESSION/PLAN: Left breast DCIS  I personally reviewed her imaging at tumor board.  She has been discussed at our multidisciplinary tumor board.  The consensus is that she would be a good candidate for breast conservation. She is enthusiastic about breast conservation.  It was a pleasure meeting the patient today. We discussed the risks, benefits, and side effects of radiotherapy. I recommend radiotherapy to the left breast to reduce her risk of locoregional recurrence by half. We discussed that radiation would take approximately 4 weeks to complete and that I would give the patient a few weeks to heal following surgery before starting treatment planning.  We spoke about acute effects including skin irritation and fatigue as well as much less common late effects including  internal organ injury or irritation. We spoke about the latest technology that is used to minimize the risk of late effects for patients undergoing radiotherapy to the breast or chest wall. No guarantees of treatment were given. The patient is enthusiastic about proceeding with treatment. I look forward to participating in the patient's care.  I will await her referral back to me for postoperative follow-up and eventual CT simulation/treatment  planning.  __________________________________________   Eppie Gibson, MD

## 2019-01-22 NOTE — Progress Notes (Signed)
Nutrition Assessment  Reason for Assessment:  Pt seen in Breast Clinic  ASSESSMENT:   53 year old female with new diagnosis of breast cancer.  Past medical history asthma, migraines, mitrial valve prolapse  Patient reports normal appetite  Medications:  reviewed  Labs: glucose 133  Anthropometrics:   Height: 66 inches Weight: 127 lb 9.6 oz BMI: 20   NUTRITION DIAGNOSIS: Food and nutrition related knowledge deficit related to new diagnosis of breast cancer as evidenced by no prior need for nutrition related information.  INTERVENTION:   Discussed and provided packet of information regarding nutritional tips for breast cancer patients.  Questions answered.  Teachback method used.  Contact information provided and patient knows to contact me with questions/concerns.    MONITORING, EVALUATION, and GOAL: Pt will consume a healthy plant based diet to maintain lean body mass throughout treatment.   Susan Mueller B. Zenia Resides, [place], [place] Registered Dietitian [phone] (pager)

## 2019-01-22 NOTE — Progress Notes (Signed)
[facility] Psychosocial Distress Screening Spiritual Care  Met with Renelda in [facility] to introduce [facility] team/resources, reviewing distress screen per protocol.  The patient scored a 4 on the Psychosocial Distress Thermometer which indicates moderate distress. Also assessed for distress and other psychosocial needs.   ONCBCN DISTRESS SCREENING 01/22/2019  Screening Type Initial Screening  Distress experienced in past week (1-10) 4  Practical problem type Work/school  Emotional problem type Adjusting to illness  Information Concerns Type Lack of info about diagnosis;Lack of info about treatment  Referral to support programs Yes   The pt presented to Breast Clinic with her husband. The pt expressed that she has a supportive community at work, as well as among her friends and family. The pt reported that since receiving additional information about her diagnosis and treatment plan, her distress has decreased to a 2. The pt shared that she is looking for the "silver lining" of her diagnosis in the way it has prompted her to reflect on what is meaningful to her in life. The pt expressed no current interest in support programs, but she reported that she will reach out if a need arises.  Follow up needed: No.   Doris Cheadle, Counseling Intern [phone]

## 2019-01-23 ENCOUNTER — Telehealth: Payer: Self-pay | Admitting: Hematology and Oncology

## 2019-01-23 ENCOUNTER — Other Ambulatory Visit: Payer: Self-pay | Admitting: General Surgery

## 2019-01-23 DIAGNOSIS — Z170 Estrogen receptor positive status [ER+]: Principal | ICD-10-CM

## 2019-01-23 DIAGNOSIS — C50412 Malignant neoplasm of upper-outer quadrant of left female breast: Secondary | ICD-10-CM

## 2019-01-23 NOTE — Telephone Encounter (Signed)
No los °

## 2019-01-24 ENCOUNTER — Other Ambulatory Visit: Payer: Self-pay | Admitting: *Deleted

## 2019-01-24 ENCOUNTER — Telehealth: Payer: Self-pay | Admitting: Hematology and Oncology

## 2019-01-24 DIAGNOSIS — D0512 Intraductal carcinoma in situ of left breast: Secondary | ICD-10-CM

## 2019-01-24 NOTE — Telephone Encounter (Signed)
Scheduled appt per 2/21 sch message - pt is aware of apt date and time   

## 2019-01-30 ENCOUNTER — Encounter (HOSPITAL_BASED_OUTPATIENT_CLINIC_OR_DEPARTMENT_OTHER): Payer: Self-pay | Admitting: *Deleted

## 2019-01-30 ENCOUNTER — Telehealth: Payer: Self-pay | Admitting: *Deleted

## 2019-01-30 ENCOUNTER — Other Ambulatory Visit: Payer: Self-pay

## 2019-01-30 NOTE — Progress Notes (Signed)
Patient to have EKG and get pre surgery Ensure on 02/07/2019 prior to seed placement

## 2019-01-30 NOTE — Telephone Encounter (Signed)
  Oncology Nurse Navigator Documentation  Navigator Location: [facility] (01/30/19 1300)   )Navigator Encounter Type: Telephone;[facility] Follow-up (01/30/19 1300) Telephone: Outgoing Call;Clinic/[facility] Follow-up (01/30/19 1300)     Surgery Date: 02/10/19 (01/30/19 1300)           Treatment Initiated Date: 02/10/19 (01/30/19 1300)                                Time Spent with Patient: 15 (01/30/19 1300)

## 2019-02-07 ENCOUNTER — Ambulatory Visit
Admission: RE | Admit: 2019-02-07 | Discharge: 2019-02-07 | Disposition: A | Discharge status: Home / Self Care | Payer: BC Managed Care – PPO | Source: Ambulatory Visit | Attending: General Surgery | Admitting: General Surgery

## 2019-02-07 ENCOUNTER — Encounter (HOSPITAL_BASED_OUTPATIENT_CLINIC_OR_DEPARTMENT_OTHER)
Admission: RE | Admit: 2019-02-07 | Discharge: 2019-02-07 | Disposition: A | Discharge status: Home / Self Care | Payer: BC Managed Care – PPO | Source: Ambulatory Visit | Attending: General Surgery | Admitting: General Surgery

## 2019-02-07 ENCOUNTER — Other Ambulatory Visit: Payer: Self-pay

## 2019-02-07 DIAGNOSIS — C50412 Malignant neoplasm of upper-outer quadrant of left female breast: Secondary | ICD-10-CM

## 2019-02-07 DIAGNOSIS — Z01810 Encounter for preprocedural cardiovascular examination: Secondary | ICD-10-CM | POA: Insufficient documentation

## 2019-02-07 DIAGNOSIS — Z170 Estrogen receptor positive status [ER+]: Principal | ICD-10-CM

## 2019-02-07 NOTE — Progress Notes (Signed)
Ensure pre surgery drink given with instructions to complete by 0630 dos, pt verbalized understanding.  EKG reviewed by Dr.Carignan, will proceed with surgery as scheduled. 

## 2019-02-08 NOTE — H&P (Signed)
Susan Mueller Location: [facility] Patient #: [id number] DOB: Dec 18, 1965 Undefined / Language: Susan Mueller / Race: White Female       History of Present Illness    . This is a very pleasant 53 year old female. She is seen in the [facility]  by Dr. Lindi Mueller, Dr. Isidore Mueller, and me. Dr. Helane Mueller is her gynecologist. The patient was referred by Dr. 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 Her husband is with her throughout the encounter today.    She has no prior history of breast problems. Annual screening mammography this year recently revealed an 8 mm area of focused suspicious calcifications in the upper outer quadrant of the left breast. There was an adjacent area of calcifications nearby which showed an oil cyst. Both of these areas have been biopsied. The 8 mmm area of calcifications show high-grade DCIS, ER 80%. PR 0. The adjacent area showed benign fibrocystic changes. We discussed her case in breast tumor board this morning. Consensus recommendation was lumpectomy, radiation therapy, antiestrogen's. She has been told to discontinue all normal replacement therapy.      Past history significant for mild asthma. Mitral valve prolapse, nonrheumatic. Left forearm trauma requiring surgery in the sixth grade. Basically healthy. Family history reveals mother and father are living and well. There is no family history of breast cancer, ovarian cancer, prostate cancer, colon cancer, or melanoma. A great grandmother may have had some type of cancer Social history reveals she is married. They have 26 year old daughter. Her husband is with her today. They live in [place]. She works for the [employer] disease. 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     We had a long talk about her disease. I pointed out that this was in situ carcinoma and as such should be highly  curable. She is aware that long-term active surveillance will be important because of the increased risk of a second primary tumor later in life. She is aware that antiestrogen skin reduce this risk and will be discussed by her oncologist. I pointed out that it was high-grade and would require treatment otherwise it would likely progress to invasive cancer. She is aware that she may have invasive cancer at this time but most likely does not. We talked about mastectomy and compared that to lumpectomy and radiation therapy. She is clearly motivated for lumpectomy. I think she is an excellent candidate for that. She will be scheduled for left breast lumpectomy with radioactive seed localization     She takes some type of hormonal replacement therapy, reportedly progesterone. I told her to get discontinue all hormonal replacement therapy She is aware of the indications, details, techniques, and risks of the surgery in detail. She is aware of the risk of bleeding, infection, cosmetic deformity, reoperation for positive margins, axillary nodal surgery if the tumor is upgraded to invasive disease on final pathology., Chronic pain, and other unforeseen problems. She understands these issues well. All of her questions are answered She agrees with this plan.    Past Surgical History  Breast Biopsy  Left. Shoulder Surgery  Left.  Diagnostic Studies History  Colonoscopy  >10 years ago Mammogram  1-3 years ago Pap Smear  1-5 years ago  Medication History  Medications Reconciled  Social History Alcohol use  Occasional alcohol use. Caffeine use  Coffee, Tea. No drug use  Tobacco use  Never smoker.  Family History Arthritis  Family Members In General. Colon Polyps  Father. Diabetes Mellitus  Family Members In General. Heart Disease  Family Members In General. Hypertension  Family Members In General. Melanoma  Family Members In General. Migraine Headache  Family Members  In General. Seizure disorder  Family Members In General.  Pregnancy / Birth History  Age at menarche  34 years. Age of menopause  70-50 Contraceptive History  Oral contraceptives. Gravida  2 Maternal age  84-35 Para  1  Other Problems  Asthma  Migraine Headache     Review of Systems  General Not Present- Appetite Loss, Chills, Fatigue, Fever, Night Sweats, Weight Gain and Weight Loss. Skin Not Present- Change in Wart/Mole, Dryness, Hives, Jaundice, New Lesions, Non-Healing Wounds, Rash and Ulcer. HEENT Present- Ringing in the Ears. Not Present- Earache, Hearing Loss, Hoarseness, Nose Bleed, Oral Ulcers, Seasonal Allergies, Sinus Pain, Sore Throat, Visual Disturbances, Wears glasses/contact lenses and Yellow Eyes. Respiratory Not Present- Bloody sputum, Chronic Cough, Difficulty Breathing, Snoring and Wheezing. Breast Not Present- Breast Mass, Breast Pain, Nipple Discharge and Skin Changes. Cardiovascular Present- Palpitations. Not Present- Chest Pain, Difficulty Breathing Lying Down, Leg Cramps, Rapid Heart Rate, Shortness of Breath and Swelling of Extremities. Gastrointestinal Not Present- Abdominal Pain, Bloating, Bloody Stool, Change in Bowel Habits, Chronic diarrhea, Constipation, Difficulty Swallowing, Excessive gas, Gets full quickly at meals, Hemorrhoids, Indigestion, Nausea, Rectal Pain and Vomiting. Female Genitourinary Not Present- Frequency, Nocturia, Painful Urination, Pelvic Pain and Urgency. Musculoskeletal Not Present- Back Pain, Joint Pain, Joint Stiffness, Muscle Pain, Muscle Weakness and Swelling of Extremities. Neurological Present- Headaches. Not Present- Decreased Memory, Fainting, Numbness, Seizures, Tingling, Tremor, Trouble walking and Weakness. Psychiatric Not Present- Anxiety, Bipolar, Change in Sleep Pattern, Depression, Fearful and Frequent crying. Endocrine Not Present- Cold Intolerance, Excessive Hunger, Hair Changes, Heat Intolerance, Hot flashes  and New Diabetes. Hematology Not Present- Blood Thinners, Easy Bruising, Excessive bleeding, Gland problems, HIV and Persistent Infections.   Physical Exam  General Mental Status-Alert. General Appearance-Consistent with stated age. Hydration-Well hydrated. Voice-Normal. Note: thin. Appears fit for her age.   Head and Neck Head-normocephalic, atraumatic with no lesions or palpable masses. Trachea-midline. Thyroid Gland Characteristics - normal size and consistency.  Eye Eyeball - Bilateral-Extraocular movements intact. Sclera/Conjunctiva - Bilateral-No scleral icterus.  Chest and Lung Exam Chest and lung exam reveals -quiet, even and easy respiratory effort with no use of accessory muscles and on auscultation, normal breath sounds, no adventitious sounds and normal vocal resonance. Inspection Chest Wall - Normal. Back - normal.  Breast Note: Breasts are small. B cup at most. Symmetrical. Biopsy sites 2 in upper left breast. Mild ecchymoses. No mass. There may be a tiny palpable left axillary lymph node, but this does not seem pathologic. Clinically she has a negative axilla. Right axilla is also negative.   Cardiovascular Cardiovascular examination reveals -normal pedal pulses bilaterally. Note: Regular rate and rhythm without ectopy. Grade 2-3 systolic murmur.  Abdomen Inspection Inspection of the abdomen reveals - No Hernias. Skin - Scar - no surgical scars. Palpation/Percussion Palpation and Percussion of the abdomen reveal - Soft, Non Tender, No Rebound tenderness, No Rigidity (guarding) and No hepatosplenomegaly. Auscultation Auscultation of the abdomen reveals - Bowel sounds normal.  Neurologic Neurologic evaluation reveals -alert and oriented x 3 with no impairment of recent or remote memory. Mental Status-Normal.  Musculoskeletal Normal Exam - Left-Upper Extremity Strength Normal and Lower Extremity Strength Normal. Normal Exam -  Right-Upper Extremity Strength Normal and Lower Extremity Strength Normal. Note: Long scar on left forearm. Previous  trauma.   Lymphatic Head & Neck  General Head & Neck Lymphatics: Bilateral - Description - Normal. Axillary  General Axillary Region: Bilateral - Description - Normal. Tenderness - Non Tender. Femoral & Inguinal  Generalized Femoral & Inguinal Lymphatics: Bilateral - Description - Normal. Tenderness - Non Tender.    Assessment & Plan PRIMARY CANCER OF UPPER OUTER QUADRANT OF LEFT FEMALE BREAST (C50.412)   Recent imaging studies and biopsies showed a small, 8 mm area of microcalcifications in the upper outer quadrant of the left breast Core needle biopsy of this area shows high-grade ductal carcinoma in situ, estrogen receptor 80%, progesterone receptor negative Core needle biopsy of an adjacent area of benign-appearing calcifications yesterday showed benign fibrocystic change  Dr. Lindi Mueller, diabetes, and Dr. Dalbert Batman have discussed the multidisciplinary approach to your early breast cancer today. Next step in your care is an operation on your breast We have discussed the options of mastectomy and compared that to lumpectomy We decided to proceed with left breast lumpectomy with radioactive seed localization Dr. Dalbert Batman discussed indications, techniques, and risk of the surgery in detail with you and your husband  Dr. Darrel Hoover office will call you tomorrow to begin the scheduling process.  MITRAL VALVE PROLAPSE (I34.1) ASTHMA IN REMISSION (R67.893)    Edsel Petrin. Dalbert Batman, M.D., [facility] General and Minimally invasive Surgery Breast and Colorectal Surgery Office:   [phone] Pager:   [phone]

## 2019-02-10 ENCOUNTER — Ambulatory Visit (HOSPITAL_BASED_OUTPATIENT_CLINIC_OR_DEPARTMENT_OTHER)
Admission: RE | Admit: 2019-02-10 | Discharge: 2019-02-10 | Disposition: A | Discharge status: Home / Self Care | Payer: BC Managed Care – PPO | Attending: General Surgery | Admitting: General Surgery

## 2019-02-10 ENCOUNTER — Ambulatory Visit (HOSPITAL_BASED_OUTPATIENT_CLINIC_OR_DEPARTMENT_OTHER): Payer: BC Managed Care – PPO | Admitting: Certified Registered"

## 2019-02-10 ENCOUNTER — Ambulatory Visit
Admission: RE | Admit: 2019-02-10 | Discharge: 2019-02-10 | Disposition: A | Discharge status: Home / Self Care | Payer: BC Managed Care – PPO | Source: Ambulatory Visit | Attending: General Surgery | Admitting: General Surgery

## 2019-02-10 ENCOUNTER — Encounter (HOSPITAL_BASED_OUTPATIENT_CLINIC_OR_DEPARTMENT_OTHER)
Admission: RE | Disposition: A | Discharge status: Home / Self Care | Payer: Self-pay | Source: Home / Self Care | Attending: General Surgery

## 2019-02-10 ENCOUNTER — Encounter (HOSPITAL_BASED_OUTPATIENT_CLINIC_OR_DEPARTMENT_OTHER): Payer: Self-pay | Admitting: Certified Registered"

## 2019-02-10 ENCOUNTER — Other Ambulatory Visit: Payer: Self-pay

## 2019-02-10 DIAGNOSIS — Z170 Estrogen receptor positive status [ER+]: Principal | ICD-10-CM

## 2019-02-10 DIAGNOSIS — J45909 Unspecified asthma, uncomplicated: Secondary | ICD-10-CM | POA: Insufficient documentation

## 2019-02-10 DIAGNOSIS — D0512 Intraductal carcinoma in situ of left breast: Secondary | ICD-10-CM | POA: Diagnosis present

## 2019-02-10 DIAGNOSIS — C50412 Malignant neoplasm of upper-outer quadrant of left female breast: Secondary | ICD-10-CM

## 2019-02-10 HISTORY — PX: BREAST LUMPECTOMY WITH RADIOACTIVE SEED LOCALIZATION: SHX6424

## 2019-02-10 HISTORY — PX: BREAST LUMPECTOMY: SHX2

## 2019-02-10 SURGERY — BREAST LUMPECTOMY WITH RADIOACTIVE SEED LOCALIZATION
Anesthesia: General | Site: Breast | Laterality: Left

## 2019-02-10 MED ORDER — CHLORHEXIDINE GLUCONATE CLOTH 2 % EX PADS: 6.0000 | MEDICATED_PAD | Freq: Once | CUTANEOUS | Status: DC

## 2019-02-10 MED ORDER — ONDANSETRON HCL 4 MG/2ML IJ SOLN
INTRAMUSCULAR | Status: DC | PRN
  Administered 2019-02-10: 4 mg via INTRAVENOUS

## 2019-02-10 MED ORDER — PROPOFOL 10 MG/ML IV BOLUS
INTRAVENOUS | Status: DC | PRN
  Administered 2019-02-10: 150 mg via INTRAVENOUS

## 2019-02-10 MED ORDER — MEPERIDINE HCL 25 MG/ML IJ SOLN: 6.2500 mg | INTRAMUSCULAR | Status: DC | PRN

## 2019-02-10 MED ORDER — CELECOXIB 200 MG PO CAPS
ORAL_CAPSULE | ORAL | Status: AC
  Filled 2019-02-10: qty 1

## 2019-02-10 MED ORDER — LACTATED RINGERS IV SOLN
INTRAVENOUS | Status: DC
  Administered 2019-02-10 (×2): via INTRAVENOUS

## 2019-02-10 MED ORDER — CELECOXIB 200 MG PO CAPS
200.0000 mg | ORAL_CAPSULE | ORAL | Status: AC
  Administered 2019-02-10: 200 mg via ORAL

## 2019-02-10 MED ORDER — HYDROCODONE-ACETAMINOPHEN 5-325 MG PO TABS: 1.0000 | ORAL_TABLET | Freq: Four times a day (QID) | ORAL | 0 refills | Status: AC | PRN

## 2019-02-10 MED ORDER — FENTANYL CITRATE (PF) 100 MCG/2ML IJ SOLN: 25.0000 ug | INTRAMUSCULAR | Status: DC | PRN

## 2019-02-10 MED ORDER — CEFAZOLIN SODIUM-DEXTROSE 2-4 GM/100ML-% IV SOLN
2.0000 g | INTRAVENOUS | Status: AC
  Administered 2019-02-10: 2 g via INTRAVENOUS

## 2019-02-10 MED ORDER — PHENYLEPHRINE HCL 10 MG/ML IJ SOLN
INTRAMUSCULAR | Status: DC | PRN
  Administered 2019-02-10: 80 ug via INTRAVENOUS

## 2019-02-10 MED ORDER — PHENYLEPHRINE 40 MCG/ML (10ML) SYRINGE FOR IV PUSH (FOR BLOOD PRESSURE SUPPORT)
PREFILLED_SYRINGE | INTRAVENOUS | Status: AC
  Filled 2019-02-10: qty 10

## 2019-02-10 MED ORDER — FENTANYL CITRATE (PF) 100 MCG/2ML IJ SOLN
50.0000 ug | INTRAMUSCULAR | Status: DC | PRN
  Administered 2019-02-10: 100 ug via INTRAVENOUS

## 2019-02-10 MED ORDER — ACETAMINOPHEN 500 MG PO TABS
ORAL_TABLET | ORAL | Status: AC
  Filled 2019-02-10: qty 2

## 2019-02-10 MED ORDER — FENTANYL CITRATE (PF) 100 MCG/2ML IJ SOLN
INTRAMUSCULAR | Status: AC
  Filled 2019-02-10: qty 2

## 2019-02-10 MED ORDER — DEXAMETHASONE SODIUM PHOSPHATE 4 MG/ML IJ SOLN
INTRAMUSCULAR | Status: DC | PRN
  Administered 2019-02-10: 10 mg via INTRAVENOUS

## 2019-02-10 MED ORDER — ONDANSETRON HCL 4 MG/2ML IJ SOLN: 4.0000 mg | Freq: Once | INTRAMUSCULAR | Status: DC | PRN

## 2019-02-10 MED ORDER — GABAPENTIN 300 MG PO CAPS
ORAL_CAPSULE | ORAL | Status: AC
  Filled 2019-02-10: qty 1

## 2019-02-10 MED ORDER — SCOPOLAMINE 1 MG/3DAYS TD PT72: 1.0000 | MEDICATED_PATCH | Freq: Once | TRANSDERMAL | Status: DC | PRN

## 2019-02-10 MED ORDER — LIDOCAINE HCL (CARDIAC) PF 100 MG/5ML IV SOSY
PREFILLED_SYRINGE | INTRAVENOUS | Status: DC | PRN
  Administered 2019-02-10: 60 mg via INTRAVENOUS

## 2019-02-10 MED ORDER — ACETAMINOPHEN 500 MG PO TABS
1000.0000 mg | ORAL_TABLET | ORAL | Status: AC
  Administered 2019-02-10: 1000 mg via ORAL

## 2019-02-10 MED ORDER — MIDAZOLAM HCL 2 MG/2ML IJ SOLN
1.0000 mg | INTRAMUSCULAR | Status: DC | PRN
  Administered 2019-02-10: 2 mg via INTRAVENOUS

## 2019-02-10 MED ORDER — OXYCODONE HCL 5 MG/5ML PO SOLN: 5.0000 mg | Freq: Once | ORAL | Status: DC | PRN

## 2019-02-10 MED ORDER — MIDAZOLAM HCL 2 MG/2ML IJ SOLN
INTRAMUSCULAR | Status: AC
  Filled 2019-02-10: qty 2

## 2019-02-10 MED ORDER — BUPIVACAINE-EPINEPHRINE (PF) 0.25% -1:200000 IJ SOLN
INTRAMUSCULAR | Status: DC | PRN
  Administered 2019-02-10: 10 mL

## 2019-02-10 MED ORDER — ACETAMINOPHEN 160 MG/5ML PO SOLN: 325.0000 mg | ORAL | Status: DC | PRN

## 2019-02-10 MED ORDER — GABAPENTIN 300 MG PO CAPS
300.0000 mg | ORAL_CAPSULE | ORAL | Status: AC
  Administered 2019-02-10: 300 mg via ORAL

## 2019-02-10 MED ORDER — ACETAMINOPHEN 325 MG PO TABS: 325.0000 mg | ORAL_TABLET | ORAL | Status: DC | PRN

## 2019-02-10 MED ORDER — KETOROLAC TROMETHAMINE 30 MG/ML IJ SOLN: 30.0000 mg | Freq: Once | INTRAMUSCULAR | Status: DC | PRN

## 2019-02-10 MED ORDER — OXYCODONE HCL 5 MG PO TABS: 5.0000 mg | ORAL_TABLET | Freq: Once | ORAL | Status: DC | PRN

## 2019-02-10 MED ORDER — CEFAZOLIN SODIUM-DEXTROSE 2-4 GM/100ML-% IV SOLN
INTRAVENOUS | Status: AC
  Filled 2019-02-10: qty 100

## 2019-02-10 SURGICAL SUPPLY — 56 items
ADH SKN CLS APL DERMABOND .7 (GAUZE/BANDAGES/DRESSINGS) ×1
APL PRP STRL LF DISP 70% ISPRP (MISCELLANEOUS) ×1
APPLIER CLIP 9.375 MED OPEN (MISCELLANEOUS) ×3
APR CLP MED 9.3 20 MLT OPN (MISCELLANEOUS) ×1
BINDER BREAST MEDIUM (GAUZE/BANDAGES/DRESSINGS) ×2 IMPLANT
BLADE HEX COATED 2.75 (ELECTRODE) ×3 IMPLANT
BLADE SURG 15 STRL LF DISP TIS (BLADE) ×1 IMPLANT
BLADE SURG 15 STRL SS (BLADE) ×3
CANISTER SUCT 1200ML W/VALVE (MISCELLANEOUS) ×3 IMPLANT
CHLORAPREP W/TINT 26 (MISCELLANEOUS) ×3 IMPLANT
CLIP APPLIE 9.375 MED OPEN (MISCELLANEOUS) IMPLANT
COVER BACK TABLE 60X90IN (DRAPES) ×3 IMPLANT
COVER MAYO STAND STRL (DRAPES) ×3 IMPLANT
COVER PROBE W GEL 5X96 (DRAPES) ×3 IMPLANT
DECANTER SPIKE VIAL GLASS SM (MISCELLANEOUS) IMPLANT
DERMABOND ADVANCED (GAUZE/BANDAGES/DRESSINGS) ×2
DERMABOND ADVANCED .7 DNX12 (GAUZE/BANDAGES/DRESSINGS) ×1 IMPLANT
DRAPE LAPAROSCOPIC ABDOMINAL (DRAPES) ×3 IMPLANT
DRAPE UTILITY XL STRL (DRAPES) ×3 IMPLANT
DRSG PAD ABDOMINAL 8X10 ST (GAUZE/BANDAGES/DRESSINGS) ×3 IMPLANT
ELECT REM PT RETURN 9FT ADLT (ELECTROSURGICAL) ×3
ELECTRODE REM PT RTRN 9FT ADLT (ELECTROSURGICAL) ×1 IMPLANT
GAUZE SPONGE 4X4 12PLY STRL LF (GAUZE/BANDAGES/DRESSINGS) ×3 IMPLANT
GLOVE BIO SURGEON STRL SZ 6.5 (GLOVE) ×1 IMPLANT
GLOVE BIO SURGEONS STRL SZ 6.5 (GLOVE) ×1
GLOVE BIOGEL PI IND STRL 7.0 (GLOVE) IMPLANT
GLOVE BIOGEL PI INDICATOR 7.0 (GLOVE) ×2
GLOVE EUDERMIC 7 POWDERFREE (GLOVE) ×3 IMPLANT
GOWN STRL REUS W/ TWL LRG LVL3 (GOWN DISPOSABLE) ×1 IMPLANT
GOWN STRL REUS W/ TWL XL LVL3 (GOWN DISPOSABLE) ×1 IMPLANT
GOWN STRL REUS W/TWL LRG LVL3 (GOWN DISPOSABLE) ×3
GOWN STRL REUS W/TWL XL LVL3 (GOWN DISPOSABLE) ×3
ILLUMINATOR WAVEGUIDE N/F (MISCELLANEOUS) IMPLANT
KIT MARKER MARGIN INK (KITS) ×3 IMPLANT
LIGHT WAVEGUIDE WIDE FLAT (MISCELLANEOUS) IMPLANT
NDL HYPO 25X1 1.5 SAFETY (NEEDLE) ×1 IMPLANT
NEEDLE HYPO 25X1 1.5 SAFETY (NEEDLE) ×3 IMPLANT
NS IRRIG 1000ML POUR BTL (IV SOLUTION) ×3 IMPLANT
PACK BASIN DAY SURGERY FS (CUSTOM PROCEDURE TRAY) ×3 IMPLANT
PENCIL BUTTON HOLSTER BLD 10FT (ELECTRODE) ×3 IMPLANT
SLEEVE SCD COMPRESS KNEE MED (MISCELLANEOUS) ×3 IMPLANT
SPONGE LAP 4X18 RFD (DISPOSABLE) ×3 IMPLANT
SUT ETHILON 3 0 FSL (SUTURE) IMPLANT
SUT MNCRL AB 4-0 PS2 18 (SUTURE) ×3 IMPLANT
SUT SILK 2 0 SH (SUTURE) ×3 IMPLANT
SUT VIC AB 2-0 CT1 27 (SUTURE)
SUT VIC AB 2-0 CT1 TAPERPNT 27 (SUTURE) IMPLANT
SUT VIC AB 3-0 SH 27 (SUTURE)
SUT VIC AB 3-0 SH 27X BRD (SUTURE) IMPLANT
SUT VICRYL 3-0 CR8 SH (SUTURE) ×3 IMPLANT
SYR 10ML LL (SYRINGE) ×3 IMPLANT
TOWEL GREEN STERILE FF (TOWEL DISPOSABLE) ×3 IMPLANT
TRAY FAXITRON CT DISP (TRAY / TRAY PROCEDURE) ×3 IMPLANT
TUBE CONNECTING 20'X1/4 (TUBING) ×1
TUBE CONNECTING 20X1/4 (TUBING) ×2 IMPLANT
YANKAUER SUCT BULB TIP NO VENT (SUCTIONS) ×3 IMPLANT

## 2019-02-10 NOTE — Op Note (Signed)
Patient Name:           Susan Mueller   Date of Surgery:        02/10/2019  Pre op Diagnosis:      Ductal carcinoma in situ left breast, upper outer quadrant, estrogen receptor positive  Post op Diagnosis:    Same  Procedure:                 Left breast lumpectomy with radioactive seed localization  Surgeon:                     Edsel Petrin. Dalbert Batman, M.D., FACS  Assistant:                      Or staff  Operative Indications:    This is a very pleasant 53 year old female. She is seen in the [facility]  by Dr. Lindi Adie, Dr. Isidore Moos, and me. Dr. Helane Rima is her gynecologist. The patient was referred by Dr. 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     She has no prior history of breast problems. Annual screening mammography this year recently revealed an 8 mm area of focal suspicious calcifications in the upper outer quadrant of the left breast. There was an adjacent area of calcifications nearby which showed an oil cyst. Both of these areas have been biopsied. The 8 mmm area of calcifications show high-grade DCIS, ER 80%. PR 0. The adjacent area showed benign fibrocystic changes. We discussed her case in breast tumor board this morning. Consensus recommendation was lumpectomy, radiation therapy, antiestrogen's. She has been told to discontinue all normal replacement therapy     We had a long talk about her disease. I pointed out that this was in situ carcinoma and as such should be highly curable. She is aware that long-term active surveillance will be important because of the increased risk of a second primary tumor later in life. She is aware that antiestrogens can reduce this risk and will be discussed by her oncologist. I pointed out that it was high-grade and would require treatment otherwise it would likely progress to invasive cancer. She is aware that she may have invasive cancer at this time but most likely does not. We talked  about mastectomy and compared that to lumpectomy and radiation therapy. She is clearly motivated for lumpectomy. I think she is an excellent candidate for that. She will be scheduled for left breast lumpectomy with radioactive seed localization  Operative Findings:       Her breasts are very small.  The radioactive seed and original titanium marker clip were in the left breast at about the 2:30 position.  The breast tissues were very thin so I took all of the breast tissue from the dermis to the pectoralis major muscle.  Therefore the anterior margin is the skin and the broad posterior margin is the pectoralis muscle.  The specimen mammogram looked good and contained the radioactive seed and the marker clip in the relative center of the specimen on 3D imaging  Procedure in Detail:          Following the induction of general LMA anesthesia the patient's left breast was prepped and draped in a sterile fashion.  Surgical timeout was performed.  Intravenous antibiotics were given.  0.25% Marcaine with epinephrine was used as a local infiltration anesthetic.    Using the neoprobe isolated the radioactive signal in the lateral left breast.  Skin incision was planned.  This was a curvilinear incision several centimeters lateral to the areolar margin.  The incision was made with a knife.  Lumpectomy was performed using electrocautery and the neoprobe.  The specimen was removed and marked with a 6 color ink kit and silk sutures to orient the pathologist.  The specimen mammogram looked good as described above.  The specimen was sent to the lab where the seed was retrieved.  Hemostasis was excellent and achieved with incision was carried down to the level electrocautery.  We lost a little bit of blood from a couple of arterial bleeders which seemed well controlled.  Lumpectomy cavity was irrigated.  The walls of the lumpectomy were marked with metal clips.  The breast tissues were reapproximated with 3-0 Vicryl sutures  and the skin closed with a running subcuticular 4-0 Monocryl and Dermabond.  Breast binder was placed and the patient taken to PACU in stable condition.  EBL 20-30  cc.  Counts correct.  Complications none.  No   Addendum: I logged onto the PMP aware website and reviewed her prescription medication history.       Edsel Petrin. Dalbert Batman, M.D., FACS General and Minimally Invasive Surgery Breast and Colorectal Surgery  02/10/2019 12:01 PM

## 2019-02-10 NOTE — Interval H&P Note (Signed)
History and Physical Interval Note:  02/10/2019 9:10 AM  Susan Mueller  has presented today for surgery, with the diagnosis of high grade ductal carcinoma in itu left breast.  The various methods of treatment have been discussed with the patient and family. After consideration of risks, benefits and other options for treatment, the patient has consented to  Procedure(s): LEFT BREAST LUMPECTOMY WITH RADIOACTIVE SEED LOCALIZATION (Left) as a surgical intervention.  The patient's history has been reviewed, patient examined, no change in status, stable for surgery.  I have reviewed the patient's chart and labs.  Questions were answered to the patient's satisfaction.     Adin Hector

## 2019-02-10 NOTE — Anesthesia Preprocedure Evaluation (Signed)
Anesthesia Evaluation  Patient identified by MRN, date of birth, ID band Patient awake    Reviewed: Allergy & Precautions, H&P , NPO status , Patient's Chart, lab work & pertinent test results  Airway Mallampati: I  TM Distance: >3 FB Neck ROM: full    Dental no notable dental hx. (+) Teeth Intact   Pulmonary asthma ,    Pulmonary exam normal breath sounds clear to auscultation       Cardiovascular negative cardio ROS Normal cardiovascular exam Rhythm:regular Rate:Normal     Neuro/Psych  Headaches, negative psych ROS   GI/Hepatic negative GI ROS, Neg liver ROS,   Endo/Other  negative endocrine ROS  Renal/GU negative Renal ROS  negative genitourinary   Musculoskeletal negative musculoskeletal ROS (+)   Abdominal Normal abdominal exam  (+)   Peds  Hematology negative hematology ROS (+)   Anesthesia Other Findings   Reproductive/Obstetrics negative OB ROS                             Anesthesia Physical Anesthesia Plan  ASA: II  Anesthesia Plan: General   Post-op Pain Management:    Induction:   PONV Risk Score and Plan: 4 or greater and Ondansetron, Dexamethasone, Midazolam and Scopolamine patch - Pre-op  Airway Management Planned: LMA  Additional Equipment:   Intra-op Plan:   Post-operative Plan: Extubation in OR  Informed Consent: I have reviewed the patients History and Physical, chart, labs and discussed the procedure including the risks, benefits and alternatives for the proposed anesthesia with the patient or authorized representative who has indicated his/her understanding and acceptance.     Dental Advisory Given  Plan Discussed with: CRNA  Anesthesia Plan Comments:         Anesthesia Quick Evaluation

## 2019-02-10 NOTE — Anesthesia Postprocedure Evaluation (Signed)
Anesthesia Post Note  Patient: Susan Mueller  Procedure(s) Performed: LEFT BREAST LUMPECTOMY WITH RADIOACTIVE SEED LOCALIZATION (Left Breast)     Patient location during evaluation: PACU Anesthesia Type: General Level of consciousness: awake Pain management: pain level controlled Vital Signs Assessment: post-procedure vital signs reviewed and stable Respiratory status: spontaneous breathing Cardiovascular status: stable Postop Assessment: no apparent nausea or vomiting Anesthetic complications: no    Last Vitals:  Vitals:   02/10/19 1224 02/10/19 1225  BP:    Pulse: 75 78  Resp: 17 18  Temp:    SpO2: 100% 100%    Last Pain:  Vitals:   02/10/19 1225  TempSrc:   PainSc: 2    Pain Goal:                   Huston Foley

## 2019-02-10 NOTE — Transfer of Care (Signed)
Immediate Anesthesia Transfer of Care Note  Patient: Susan Mueller  Procedure(s) Performed: LEFT BREAST LUMPECTOMY WITH RADIOACTIVE SEED LOCALIZATION (Left Breast)  Patient Location: PACU  Anesthesia Type:General  Level of Consciousness: drowsy and patient cooperative  Airway & Oxygen Therapy: Patient Spontanous Breathing and Patient connected to face mask oxygen  Post-op Assessment: Report given to RN and Post -op Vital signs reviewed and stable  Post vital signs: Reviewed and stable  Last Vitals:  Vitals Value Taken Time  BP    Temp    Pulse 78 02/10/2019 11:59 AM  Resp    SpO2 100 % 02/10/2019 11:59 AM  Vitals shown include unvalidated device data.  Last Pain:  Vitals:   02/10/19 0925  TempSrc: Oral  PainSc: 0-No pain         Complications: No apparent anesthesia complications

## 2019-02-10 NOTE — Anesthesia Procedure Notes (Signed)
Procedure Name: LMA Insertion Date/Time: 02/10/2019 11:03 AM Performed by: Signe Colt, CRNA Pre-anesthesia Checklist: Patient identified, Emergency Drugs available, Suction available and Patient being monitored Patient Re-evaluated:Patient Re-evaluated prior to induction Oxygen Delivery Method: Circle system utilized Preoxygenation: Pre-oxygenation with 100% oxygen Induction Type: IV induction Ventilation: Mask ventilation without difficulty LMA: LMA inserted LMA Size: 4.0 Number of attempts: 1 Airway Equipment and Method: Bite block Placement Confirmation: positive ETCO2 Tube secured with: Tape Dental Injury: Teeth and Oropharynx as per pre-operative assessment

## 2019-02-10 NOTE — Discharge Instructions (Signed)
[place] Office Phone Number [phone]  BREAST BIOPSY/ PARTIAL MASTECTOMY: POST OP INSTRUCTIONS  Always review your discharge instruction sheet given to you by the facility where your surgery was performed.  IF YOU HAVE DISABILITY OR FAMILY LEAVE FORMS, YOU MUST BRING THEM TO THE OFFICE FOR PROCESSING.  DO NOT GIVE THEM TO YOUR DOCTOR.  1. A prescription for pain medication may be given to you upon discharge.  Take your pain medication as prescribed, if needed.  If narcotic pain medicine is not needed, then you may take acetaminophen (Tylenol) or ibuprofen (Advil) as needed. 2. Take your usually prescribed medications unless otherwise directed 3. If you need a refill on your pain medication, please contact your pharmacy.  They will contact our office to request authorization.  Prescriptions will not be filled after 5pm or on week-ends. 4. You should eat very light the first 24 hours after surgery, such as soup, crackers, pudding, etc.  Resume your normal diet the day after surgery. 5. Most patients will experience some swelling and bruising in the breast.  Ice packs and a good support bra will help.  Swelling and bruising can take several days to resolve.  6. It is common to experience some constipation if taking pain medication after surgery.  Increasing fluid intake and taking a stool softener will usually help or prevent this problem from occurring.  A mild laxative (Milk of Magnesia or Miralax) should be taken according to package directions if there are no bowel movements after 48 hours. 7. Unless discharge instructions indicate otherwise, you may remove your bandages 24-48 hours after surgery, and you may shower at that time.  You may have steri-strips (small skin tapes) in place directly over the incision.  These strips should be left on the skin for 7-10 days.  If your surgeon used skin glue on the incision, you may shower in 24 hours.  The glue will flake off over the  next 2-3 weeks.  Any sutures or staples will be removed at the office during your follow-up visit. 8. ACTIVITIES:  You may resume regular daily activities (gradually increasing) beginning the next day.  Wearing a good support bra or sports bra minimizes pain and swelling.  You may have sexual intercourse when it is comfortable. a. You may drive when you no longer are taking prescription pain medication, you can comfortably wear a seatbelt, and you can safely maneuver your car and apply brakes. b. RETURN TO WORK:  ______________________________________________________________________________________ 9. You should see your doctor in the office for a follow-up appointment approximately two weeks after your surgery.  Your doctors nurse will typically make your follow-up appointment when she calls you with your pathology report.  Expect your pathology report 2-3 business days after your surgery.  You may call to check if you do not hear from [place] after three days. 10. OTHER INSTRUCTIONS: _______________________________________________________________________________________________ _____________________________________________________________________________________________________________________________________ _____________________________________________________________________________________________________________________________________ _____________________________________________________________________________________________________________________________________  WHEN TO CALL YOUR DOCTOR: 1. Fever over 101.0 2. Nausea and/or vomiting. 3. Extreme swelling or bruising. 4. Continued bleeding from incision. 5. Increased pain, redness, or drainage from the incision.  The clinic staff is available to answer your questions during regular business hours.  Please dont hesitate to call and ask to speak to one of the nurses for clinical concerns.  If you have a medical emergency, go to the nearest  emergency room or call 911.  A surgeon from [facility] is always on call at the hospital.  For further questions, please visit [email or web address]  Use high-dose Tylenol and high-dose ibuprofen for pain control This will likely be adequate If not, I have called in a prescription for hydrocodone to your pharmacy if you need it         Managing Your Pain After Surgery Without Opioids    Thank you for participating in our program to help patients manage their pain after surgery without opioids. This is part of our effort to provide you with the best care possible, without exposing you or your family to the risk that opioids pose.  What pain can I expect after surgery? You can expect to have some pain after surgery. This is normal. The pain is typically worse the day after surgery, and quickly begins to get better. Many studies have found that many patients are able to manage their pain after surgery with Over-the-Counter (OTC) medications such as Tylenol and Motrin. If you have a condition that does not allow you to take Tylenol or Motrin, notify your surgical team.  How will I manage my pain? The best strategy for controlling your pain after surgery is around the clock pain control with Tylenol (acetaminophen) and Motrin (ibuprofen or Advil). Alternating these medications with each other allows you to maximize your pain control. In addition to Tylenol and Motrin, you can use heating pads or ice packs on your incisions to help reduce your pain.  How will I alternate your regular strength over-the-counter pain medication? You will take a dose of pain medication every three hours. ; Start by taking 650 mg of Tylenol (2 pills of 325 mg) ; 3 hours later take 600 mg of Motrin (3 pills of 200 mg) ; 3 hours after taking the Motrin take 650 mg of Tylenol ; 3 hours after that take 600 mg of Motrin.   - 1 -  See example - if your first dose of Tylenol  is at 12:00 PM   12:00 PM Tylenol 650 mg (2 pills of 325 mg)  3:00 PM Motrin 600 mg (3 pills of 200 mg)  6:00 PM Tylenol 650 mg (2 pills of 325 mg)  9:00 PM Motrin 600 mg (3 pills of 200 mg)  Continue alternating every 3 hours   We recommend that you follow this schedule around-the-clock for at least 3 days after surgery, or until you feel that it is no longer needed. Use the table on the last page of this handout to keep track of the medications you are taking. Important: Do not take more than 3000mg  of Tylenol or 3200mg  of Motrin in a 24-hour period. Do not take ibuprofen/Motrin if you have a history of bleeding stomach ulcers, severe kidney disease, &/or actively taking a blood thinner  What if I still have pain? If you have pain that is not controlled with the over-the-counter pain medications (Tylenol and Motrin or Advil) you might have what we call breakthrough pain. You will receive a prescription for a small amount of an opioid pain medication such as Oxycodone, Tramadol, or Tylenol with Codeine. Use these opioid pills in the first 24 hours after surgery if you have breakthrough pain. Do not take more than 1 pill every 4-6 hours.  If you still have uncontrolled pain after using all opioid pills, don't hesitate to call our staff using the number provided. We will help make sure you are managing your pain in the best way possible, and if necessary, we can provide a prescription for additional pain medication.   Day 1    Time  Name of Medication Number of pills taken  Amount of Acetaminophen  Pain Level   Comments  AM PM       AM PM       AM PM       AM PM       AM PM       AM PM       AM PM       AM PM       Total Daily amount of Acetaminophen Do not take more than  3,000 mg per day      Day 2    Time  Name of Medication Number of pills taken  Amount of Acetaminophen  Pain Level   Comments  AM PM       AM PM       AM PM       AM PM       AM PM       AM PM        AM PM       AM PM       Total Daily amount of Acetaminophen Do not take more than  3,000 mg per day      Day 3    Time  Name of Medication Number of pills taken  Amount of Acetaminophen  Pain Level   Comments  AM PM       AM PM       AM PM       AM PM          AM PM       AM PM       AM PM       AM PM       Total Daily amount of Acetaminophen Do not take more than  3,000 mg per day      Day 4    Time  Name of Medication Number of pills taken  Amount of Acetaminophen  Pain Level   Comments  AM PM       AM PM       AM PM       AM PM       AM PM       AM PM       AM PM       AM PM       Total Daily amount of Acetaminophen Do not take more than  3,000 mg per day      Day 5    Time  Name of Medication Number of pills taken  Amount of Acetaminophen  Pain Level   Comments  AM PM       AM PM       AM PM       AM PM       AM PM       AM PM       AM PM       AM PM       Total Daily amount of Acetaminophen Do not take more than  3,000 mg per day       Day 6    Time  Name of Medication Number of pills taken  Amount of Acetaminophen  Pain Level  Comments  AM PM       AM PM       AM PM       AM PM       AM  PM       AM PM       AM PM       AM PM       Total Daily amount of Acetaminophen Do not take more than  3,000 mg per day      Day 7    Time  Name of Medication Number of pills taken  Amount of Acetaminophen  Pain Level   Comments  AM PM       AM PM       AM PM       AM PM       AM PM       AM PM       AM PM       AM PM       Total Daily amount of Acetaminophen Do not take more than  3,000 mg per day        For additional information about how and where to safely dispose of unused opioid medications - [employer]  Disclaimer: This document contains information and/or instructional materials adapted from [place] [facility] for the typical patient with your condition. It does not replace  medical advice from your health care provider because your experience may differ from that of the typical patient. Talk to your health care provider if you have any questions about this document, your condition or your treatment plan. Adapted from [place] Instructions  Activity: Get plenty of rest for the remainder of the day. A responsible individual must stay with you for 24 hours following the procedure.  For the next 24 hours, DO NOT: -Drive a car -Paediatric nurse -Drink alcoholic beverages -Take any medication unless instructed by your physician -Make any legal decisions or sign important papers.  Meals: Start with liquid foods such as gelatin or soup. Progress to regular foods as tolerated. Avoid greasy, spicy, heavy foods. If nausea and/or vomiting occur, drink only clear liquids until the nausea and/or vomiting subsides. Call your physician if vomiting continues.  Special Instructions/Symptoms: Your throat may feel dry or sore from the anesthesia or the breathing tube placed in your throat during surgery. If this causes discomfort, gargle with warm salt water. The discomfort should disappear within 24 hours.  If you had a scopolamine patch placed behind your ear for the management of post- operative nausea and/or vomiting:  1. The medication in the patch is effective for 72 hours, after which it should be removed.  Wrap patch in a tissue and discard in the trash. Wash hands thoroughly with soap and water. 2. You may remove the patch earlier than 72 hours if you experience unpleasant side effects which may include dry mouth, dizziness or visual disturbances. 3. Avoid touching the patch. Wash your hands with soap and water after contact with the patch.

## 2019-02-11 ENCOUNTER — Encounter (HOSPITAL_BASED_OUTPATIENT_CLINIC_OR_DEPARTMENT_OTHER): Payer: Self-pay | Admitting: General Surgery

## 2019-02-18 ENCOUNTER — Other Ambulatory Visit: Payer: Self-pay | Admitting: General Surgery

## 2019-02-19 NOTE — Progress Notes (Signed)
Location of Breast Cancer: Left Breast  Histology per Pathology Report:  01/15/19 Diagnosis Breast, left, needle core biopsy, upper, outer; coil clip - DUCTAL CARCINOMA IN SITU WITH CALCIFICATIONS.  Receptor Status: ER(80%), PR (NEG)  02/10/19 Diagnosis Breast, lumpectomy, Left w/seed - DUCTAL CARCINOMA IN SITU, HIGH NUCLEAR GRADE WITH CENTRAL NECROSIS. - MARGINS OF RESECTION ARE NOT INVOLVED (CLOSEST MARGIN: LESS THAN 1 MM TO SUPERIOR, MEDIAL AND LATERAL). - BIOPSY SITE CHANGES.  Did patient present with symptoms or was this found on screening mammography?: It was found on a screening mammogram.   Past/Anticipated interventions by surgeon, if any: 02/10/19 Procedure:                 Left breast lumpectomy with radioactive seed localization Surgeon:                     Edsel Petrin. Dalbert Batman, M.D., [facility]  Past/Anticipated interventions by medical oncology, if any:  01/22/19 Breast Clinic Dr. Lindi Adie: Recommendation: 1. Breast conserving surgery 2. Followed by adjuvant radiation therapy 3. Followed by antiestrogen therapy with tamoxifen 5 years  Lymphedema issues, if any:  She denies. She has good arm mobility.   Pain issues, if any:  She denies.   SAFETY ISSUES:  Prior radiation? No  Pacemaker/ICD? No  Possible current pregnancy? She is post-menopausal.   Is the patient on methotrexate? No  Current Complaints / other details:   She would like to discuss future surgery options. Her margins were close, less than 1 mm. She would like to discuss possibility of another surgery with Dr. Isidore Moos.   BP (!) 142/83 (BP Location: Left Arm, Patient Position: Sitting)   Pulse 76   Temp 98 F (36.7 C) (Oral)   Resp 20   Ht 5\' 6"  (1.676 m)   Wt 127 lb 12.8 oz (58 kg)   SpO2 100%   BMI 20.63 kg/m    Wt Readings from Last 3 Encounters:  02/21/19 127 lb 12.8 oz (58 kg)  02/10/19 125 lb 10.6 oz (57 kg)  01/22/19 127 lb 9.6 oz (57.9 kg)      Raihan Kimmel, Stephani Police, RN 02/20/2019,1:03  PM

## 2019-02-20 ENCOUNTER — Telehealth: Payer: Self-pay

## 2019-02-20 NOTE — Progress Notes (Signed)
HEMATOLOGY-ONCOLOGY TELEPHONE VISIT PROGRESS NOTE  I connected with Susan Mueller on 02/25/19 at  3:00 PM EDT by telephone and verified that I am speaking with the correct person using two identifiers.  I discussed the limitations, risks, security and privacy concerns of performing an evaluation and management service by telephone and the availability of in person appointments.  I also discussed with the patient that there may be a patient responsible charge related to this service. The patient expressed understanding and agreed to proceed.   History of Present Illness: Follow-up after surgery for DCIS    Ductal carcinoma in situ (DCIS) of left breast   01/15/2019 Initial Diagnosis    Screening detected left breast calcifications UOQ 8 mm: Biopsy revealed high-grade DCIS with calcifications and necrosis ER 80% strong positive, PR 0%; second group of calcifications medially?  Oil cyst biopsy pending; Tis NX stage 0    02/10/2019 Surgery    Left lumpectomy: DCIS high-grade with central necrosis, margins negative, 2 cm size, ER 80%, PR 0%Tis NX stage 0    Observations/Objective:   He is a 53 year old with above-mentioned history of left breast DCIS who underwent left lumpectomy and is doing a phone consultation to discuss pathology report and the adjuvant treatment plan.  She is healing and recovering very well from recent surgery.  We had lengthy discussion between the surgeon and radiation oncology and myself to discuss her margin issue.  We felt that the margins are adequate and that radiation therapy followed by antiestrogen therapy would be the best solution/recommendation for her.   Assessment Plan:  Ductal carcinoma in situ (DCIS) of left breast 02/10/2019: Left lumpectomy: DCIS high-grade with central necrosis, margins negative, 2 cm size, ER 80%, PR 0%Tis NX stage 0  Pathology counseling: I discussed the final pathology report of the patient provided  a copy of this report. I discussed the  margins. We also discussed the final staging along with previously performed ER/PR testing.  Recommendation: 1.  Adjuvant radiation therapy  2. followed by adjuvant tamoxifen for 5 years  Return to clinic at the end of radiation therapy for follow-up.   I discussed the assessment and treatment plan with the patient. The patient was provided an opportunity to ask questions and all were answered. The patient agreed with the plan and demonstrated an understanding of the instructions. The patient was advised to call back or seek an in-person evaluation if the symptoms worsen or if the condition fails to improve as anticipated.   I provided 15 minutes of non-face-to-face time during this encounter. Harriette Ohara, MD

## 2019-02-20 NOTE — Progress Notes (Signed)
Radiation Oncology         [phone] ________________________________  Name: Susan Mueller MRN: [id number]  Date: 02/21/2019  DOB: 03-06-1966  Follow-Up Visit Note  Outpatient  CC: Dian Queen, MD  Dian Queen, MD  Diagnosis:      ICD-10-CM   1. Ductal carcinoma in situ (DCIS) of left breast D05.12      Cancer Staging Ductal carcinoma in situ (DCIS) of left breast Staging form: Breast, AJCC 8th Edition - Clinical stage from 01/22/2019: Stage 0 (cTis (DCIS), cN0, cM0, ER+, PR-, HER2: Not Assessed) - Unsigned   CHIEF COMPLAINT: Here to discuss management of left breast DCIS  Narrative:  The patient returns today for follow-up to discuss radiation treatment options.    She opted to proceed with left breast lumpectomy on date of 02/10/2019 with pathology report revealing: tumor size of 2 cm; histology of ductal carcinoma in situ; margin status to in situ disease of less than 1 mm to superior, medial, and lateral; ER status: 80%; PR status 0%; Grade 3.  She last met with Dr. Dalbert Batman on 02/18/2019. They discussed her pathology results and the implication of margins under 2 mm. Per Dr. Darrel Hoover note, she will tentatively be scheduled for margin re-excision, which would occur prior to starting radiation therapy.  She is also scheduled to see Dr. Lindi Adie on 02/25/2019 to discuss antiestrogen options.  She would like to discuss the pros and cons of reexcision today.  She acknowledges based on her conversation with Dr. Dalbert Batman that she would have a suboptimal cosmetic outcome with reexcision in light of her small breast size  She also has questions about how the [place] pandemic may or may not play a role in her prognosis and treatment        ALLERGIES:  is allergic to avelox [moxifloxacin hcl in nacl]; codeine; and septra [sulfamethoxazole-trimethoprim].  Meds: Current Outpatient Medications  Medication Sig Dispense Refill  . albuterol (PROAIR HFA) 108 (90 Base) MCG/ACT  inhaler Inhale 2 puffs into the lungs every 4 (four) hours as needed for wheezing or shortness of breath. 1 Inhaler 0  . budesonide (PULMICORT FLEXHALER) 180 MCG/ACT inhaler Inhale 2 puffs into the lungs 2 (two) times daily. 1 Inhaler 3  . HYDROcodone-acetaminophen (NORCO) 5-325 MG tablet Take 1-2 tablets by mouth every 6 (six) hours as needed for moderate pain or severe pain. (Patient not taking: Reported on 02/21/2019) 20 tablet 0  . Multiple Vitamins-Minerals (MULTIVITAMIN WITH MINERALS) tablet Take 1 tablet by mouth daily.     No current facility-administered medications for this encounter.     Physical Findings:  height is '5\' 6"'$  (1.676 m) and weight is 127 lb 12.8 oz (58 kg). Her oral temperature is 98 F (36.7 C). Her blood pressure is 142/83 (abnormal) and her pulse is 76. Her respiration is 20 and oxygen saturation is 100%. .    General: Alert and oriented, in no acute distress HEENT: Head is normocephalic.  Psychiatric: Judgment and insight are intact. Affect is appropriate. Breast exam reveals healing lumpectomy scar left breast  Lab Findings: Lab Results  Component Value Date   WBC 6.4 01/22/2019   HGB 13.7 01/22/2019   HCT 39.4 01/22/2019   MCV 92.1 01/22/2019   PLT 208 01/22/2019    Radiographic Findings: Mm Breast Surgical Specimen  Result Date: 02/10/2019 CLINICAL DATA:  Left lumpectomy for recently diagnosed ductal carcinoma in situ. EXAM: SPECIMEN RADIOGRAPH OF THE LEFT BREAST COMPARISON:  Previous exam(s). FINDINGS: Status post excision of  the left breast. The radioactive seed and coil shaped biopsy marker clip are present and completely intact. IMPRESSION: Specimen radiograph of the left breast. Electronically Signed   By: Claudie Revering M.D.   On: 02/10/2019 12:14   Mm Lt Radioactive Seed Loc Mammo Guide  Result Date: 02/07/2019 CLINICAL DATA:  Biopsy-proven high-grade DCIS involving the UPPER OUTER QUADRANT of the LEFT breast at MIDDLE to POSTERIOR depth (coil clip  placed at the time of biopsy). Benign biopsy of calcifications in the INNER LEFT breast at MIDDLE depth with pathology revealing fat necrosis (X shaped clip placed at the time of biopsy). The coil shaped tissue marker clip associated with the biopsy-proven DCIS is localized in anticipation of lumpectomy on 02/10/2019. EXAM: MAMMOGRAPHIC GUIDED RADIOACTIVE SEED LOCALIZATION OF THE LEFT BREAST COMPARISON:  Previous exam(s). FINDINGS: Patient presents for radioactive seed localization prior to LEFT breast lumpectomy. I met with the patient and we discussed the procedure of seed localization including benefits and alternatives. We discussed the high likelihood of a successful procedure. We discussed the risks of the procedure including infection, bleeding, tissue injury and further surgery. We discussed the low dose of radioactivity involved in the procedure. Informed, written consent was given. The usual time-out protocol was performed immediately prior to the procedure. Using mammographic guidance, sterile technique with chlorhexidine as skin antisepsis, 1% lidocaine as local anesthetic, an I-125 radioactive seed was used to localize the coil shaped tissue marker clip and the residual faint calcifications in the UPPER OUTER QUADRANT of the LEFT breast using a LATERAL approach. The follow-up mammogram images confirm the seed in the expected location immediately adjacent to the coil shaped clip and the residual calcifications. The images were marked for Dr. Dalbert Batman. Follow-up survey of the patient confirms the presence of the radioactive seed. Order number of I-125 seed: [id number] Total activity: 0.253 Reference Date: 02/04/2019 The patient tolerated the procedure well and was released from the [facility]. She was given instructions regarding seed removal. IMPRESSION: Radioactive seed localization of biopsy-proven high-grade DCIS involving the UPPER OUTER QUADRANT of the LEFT breast. No apparent complications.  Electronically Signed   By: Evangeline Dakin M.D.   On: 02/07/2019 14:38    Impression/Plan: Left Breast DCIS, ER+  We had a lengthy discussion today.  We went over the [employer] for DCIS while acknowledging that nomograms have their limitations.  If she does not undergo reexcision her risk of recurrence in the breast over the next decade is about 11% with adjuvant radiation alone.  Risk would be 5% over the next decade with adjuvant radiation and anti-antiestrogen therapy.  Risk would be 3% with widely negative margins and radiation and antiestrogen therapy.  Even if these nomograms underestimate her risk, the likelihood of a recurrence with adjuvant therapy is still is relatively low.  We discussed the importance of close follow-up with annual mammograms  I did speak with radiology about her mammograms and her post lumpectomy specimen.  It appears that all of the suspicious calcifications were removed at the time of surgery.  Therefore I do not think a postoperative mammogram is necessary  After lengthy discussion and reviewing the data she would like to proceed with radiotherapy and I think this is a very reasonable choice.  She will call off her reexcision  We discussed adjuvant radiotherapy today.  I recommend 4 weeks directed at the left breast in order to reduce the risk of locoregional recurrence by 1/2.  The risks, benefits and side effects of this  treatment were discussed in detail.  She understands that radiotherapy is associated with skin irritation and fatigue in the acute setting. Late effects can include cosmetic changes and rare injury to internal organs.  She is enthusiastic about proceeding with treatment. A consent form has been signed and placed in her chart   The patient will receive 3D conformal radiotherapy, 40.05 Gy in 15 fractions to the left breast with 2 fields.  This will be followed by a boost.  We discussed measures to reduce the risk of  infection during the COVID-19 pandemic.  She understands that radiotherapy to the breast does not impact her immune system.  However taking special precautions to minimize her risk of infection are important.  I spent 25 minutes minutes face to face with the patient and more than 50% of that time was spent in counseling and/or coordination of care. _____________________________________   Eppie Gibson, MD   This document serves as a record of services personally performed by Eppie Gibson, MD. It was created on her behalf by Wilburn Mylar, a trained medical scribe. The creation of this record is based on the scribe's personal observations and the provider's statements to them. This document has been checked and approved by the attending provider.

## 2019-02-20 NOTE — Telephone Encounter (Signed)
Susan Mueller was called to pre-screen for COVID-19 due to her scheduled appointment with Dr. Isidore Moos tomorrow. She denies recent travel, fever, cough, or shortness of breath. She will be here for her appointment tomorrow.

## 2019-02-21 ENCOUNTER — Ambulatory Visit
Admission: RE | Admit: 2019-02-21 | Discharge: 2019-02-21 | Disposition: A | Discharge status: Home / Self Care | Payer: BC Managed Care – PPO | Source: Ambulatory Visit | Attending: Radiation Oncology | Admitting: Radiation Oncology

## 2019-02-21 ENCOUNTER — Encounter: Payer: Self-pay | Admitting: Radiation Oncology

## 2019-02-21 ENCOUNTER — Other Ambulatory Visit: Payer: Self-pay

## 2019-02-21 VITALS — BP 142/83 | HR 76 | Temp 98.0°F | Resp 20 | Ht 66.0 in | Wt 127.8 lb

## 2019-02-21 DIAGNOSIS — Z79899 Other long term (current) drug therapy: Secondary | ICD-10-CM | POA: Insufficient documentation

## 2019-02-21 DIAGNOSIS — D0512 Intraductal carcinoma in situ of left breast: Secondary | ICD-10-CM

## 2019-02-21 DIAGNOSIS — Z170 Estrogen receptor positive status [ER+]: Principal | ICD-10-CM

## 2019-02-21 DIAGNOSIS — C50412 Malignant neoplasm of upper-outer quadrant of left female breast: Secondary | ICD-10-CM

## 2019-02-21 DIAGNOSIS — Z510 Encounter for antineoplastic radiation therapy: Secondary | ICD-10-CM | POA: Insufficient documentation

## 2019-02-21 NOTE — Progress Notes (Signed)
  Radiation Oncology         [phone] ________________________________  Name: Susan Mueller MRN: [id number]  Date: 02/21/2019  DOB: 17-Feb-1966  SIMULATION AND TREATMENT PLANNING NOTE   Outpatient  DIAGNOSIS:     ICD-10-CM   1. Ductal carcinoma in situ (DCIS) of left breast D05.12     NARRATIVE:  The patient was brought to the [facility].  Identity was confirmed.  All relevant records and images related to the planned course of therapy were reviewed.  The patient freely provided informed written consent to proceed with treatment after reviewing the details related to the planned course of therapy. The consent form was witnessed and verified by the simulation staff.    Then, the patient was set-up in a stable reproducible supine position for radiation therapy with her ipsilateral arm over her head, and her upper body secured in a custom-made Vac-lok device.  CT images were obtained.  Surface markings were placed.  The CT images were loaded into the planning software.    TREATMENT PLANNING NOTE: Treatment planning then occurred.  The radiation prescription was entered and confirmed.     A total of 3 medically necessary complex treatment devices were fabricated and supervised by me: 2 fields with MLCs for custom blocks to protect heart, and lungs;  and, a Vac-lok. MORE COMPLEX DEVICES MAY BE MADE IN DOSIMETRY FOR FIELD IN FIELD BEAMS FOR DOSE HOMOGENEITY.  I have requested : 3D Simulation which is medically necessary to give adequate dose to at risk tissues while sparing lungs and heart.  I have requested a DVH of the following structures: lungs, heart, left lumpectomy cavity.    The patient will receive 40.05 Gy in 15 fractions to the left breast with 2 tangential fields.  This will be followed by a boost.  Optical Surface Tracking Plan:  Since intensity modulated radiotherapy (IMRT) and 3D conformal radiation treatment methods are predicated on accurate and precise  positioning for treatment, intrafraction motion monitoring is medically necessary to ensure accurate and safe treatment delivery. The ability to quantify intrafraction motion without excessive ionizing radiation dose can only be performed with optical surface tracking. Accordingly, surface imaging offers the opportunity to obtain 3D measurements of patient position throughout IMRT and 3D treatments without excessive radiation exposure. I am ordering optical surface tracking for this patient's upcoming course of radiotherapy.  ________________________________   Reference:  Ursula Alert, J, et al. Surface imaging-based analysis of intrafraction motion for breast radiotherapy patients.Journal of [place], n. 6, nov. 2014. ISSN [id number].  Available at: <http://www.jacmp.org/index.php/jacmp/article/view/4957>.    -----------------------------------  Eppie Gibson, MD

## 2019-02-21 NOTE — Patient Instructions (Signed)
Coronavirus (COVID-19) Are you at risk?  Are you at risk for the Coronavirus (COVID-19)?  To be considered HIGH RISK for Coronavirus (COVID-19), you have to meet the following criteria:  . Traveled to [place], [place], [place], [place] or [place]; or in the [place] to [place], [place], [place], or [place]; and have fever, cough, and shortness of breath within the last 2 weeks of travel OR . Been in close contact with a person diagnosed with COVID-19 within the last 2 weeks and have fever, cough, and shortness of breath . IF YOU DO NOT MEET THESE CRITERIA, YOU ARE CONSIDERED LOW RISK FOR COVID-19.  What to do if you are HIGH RISK for COVID-19?  . If you are having a medical emergency, call 911. . Seek medical care right away. Before you go to a doctor's office, urgent care or emergency department, call ahead and tell them about your recent travel, contact with someone diagnosed with COVID-19, and your symptoms. You should receive instructions from your physician's office regarding next steps of care.  . When you arrive at healthcare provider, tell the healthcare staff immediately you have returned from visiting [place], [place], [place], [place] or [place]; or traveled in the [place] to [place], [place], [place], or [place]; in the last two weeks or you have been in close contact with a person diagnosed with COVID-19 in the last 2 weeks.   . Tell the health care staff about your symptoms: fever, cough and shortness of breath. . After you have been seen by a medical provider, you will be either: o Tested for (COVID-19) and discharged home on quarantine except to seek medical care if symptoms worsen, and asked to  - Stay home and avoid contact with others until you get your results (4-5 days)  - Avoid travel on public transportation if possible (such as bus, train, or airplane) or o Sent to the Emergency Department by EMS for evaluation, COVID-19 testing, and possible  admission depending on your condition and test results.  What to do if you are LOW RISK for COVID-19?  Reduce your risk of any infection by using the same precautions used for avoiding the common cold or flu:  . Wash your hands often with soap and warm water for at least 20 seconds.  If soap and water are not readily available, use an alcohol-based hand sanitizer with at least 60% alcohol.  . If coughing or sneezing, cover your mouth and nose by coughing or sneezing into the elbow areas of your shirt or coat, into a tissue or into your sleeve (not your hands). . Avoid shaking hands with others and consider head nods or verbal greetings only. . Avoid touching your eyes, nose, or mouth with unwashed hands.  . Avoid close contact with people who are sick. . Avoid places or events with large numbers of people in one location, like concerts or sporting events. . Carefully consider travel plans you have or are making. . If you are planning any travel outside or inside the [place], visit the CDC's Travelers' Health webpage for the latest health notices. . If you have some symptoms but not all symptoms, continue to monitor at home and seek medical attention if your symptoms worsen. . If you are having a medical emergency, call 911.   ADDITIONAL HEALTHCARE OPTIONS FOR PATIENTS  [facility] / e-Visit: [email or web address]         [facility]: [phone]  [facility]: [phone]                   [facility]: [phone]   

## 2019-02-24 ENCOUNTER — Ambulatory Visit: Payer: BC Managed Care – PPO | Admitting: Hematology and Oncology

## 2019-02-24 ENCOUNTER — Telehealth: Payer: Self-pay | Admitting: Hematology and Oncology

## 2019-02-24 NOTE — Telephone Encounter (Signed)
I talk with Susan Mueller and she said patient needed to still come in on 3/24. I did tell the patient that and she is asking MD about 4/27 because she has radiation.

## 2019-02-25 ENCOUNTER — Inpatient Hospital Stay: Payer: BC Managed Care – PPO | Attending: Hematology and Oncology | Admitting: Hematology and Oncology

## 2019-02-25 DIAGNOSIS — D0512 Intraductal carcinoma in situ of left breast: Secondary | ICD-10-CM | POA: Diagnosis not present

## 2019-02-25 NOTE — Assessment & Plan Note (Signed)
02/10/2019: Left lumpectomy: DCIS high-grade with central necrosis, margins negative, 2 cm size, ER 80%, PR 0%Tis NX stage 0  Pathology counseling: I discussed the final pathology report of the patient provided  a copy of this report. I discussed the margins. We also discussed the final staging along with previously performed ER/PR testing.  Recommendation: 1.  Adjuvant radiation therapy  2. followed by adjuvant tamoxifen for 5 years  Return to clinic at the end of radiation therapy for follow-up.

## 2019-02-26 NOTE — Progress Notes (Signed)
HEMATOLOGY-ONCOLOGY TELEPHONE VISIT PROGRESS NOTE  I connected with Susan Mueller on 02/25/19 at  3:00 PM EDT by telephone and verified that I am speaking with the correct person using two identifiers.  I discussed the limitations, risks, security and privacy concerns of performing an evaluation and management service by telephone and the availability of in person appointments.  I also discussed with the patient that there may be a patient responsible charge related to this service. The patient expressed understanding and agreed to proceed.   History of Present Illness: Follow-up after surgery for DCIS        Ductal carcinoma in situ (DCIS) of left breast   01/15/2019 Initial Diagnosis    Screening detected left breast calcifications UOQ 8 mm: Biopsy revealed high-grade DCIS with calcifications and necrosis ER 80% strong positive, PR 0%; second group of calcifications medially?  Oil cyst biopsy pending; Tis NX stage 0    02/10/2019 Surgery    Left lumpectomy: DCIS high-grade with central necrosis, margins negative, 2 cm size, ER 80%, PR 0%Tis NX stage 0    Observations/Objective:   He is a 53 year old with above-mentioned history of left breast DCIS who underwent left lumpectomy and is doing a phone consultation to discuss pathology report and the adjuvant treatment plan.  She is healing and recovering very well from recent surgery.  We had lengthy discussion between the surgeon and radiation oncology and myself to discuss her margin issue.  We felt that the margins are adequate and that radiation therapy followed by antiestrogen therapy would be the best solution/recommendation for her.   Assessment Plan:  Ductal carcinoma in situ (DCIS) of left breast 02/10/2019: Left lumpectomy: DCIS high-grade with central necrosis, margins negative, 2 cm size, ER 80%, PR 0%Tis NX stage 0  Pathology counseling: I discussed the final pathology report of the patient provided  a copy of this report. I  discussed the margins. We also discussed the final staging along with previously performed ER/PR testing.  Recommendation: 1.  Adjuvant radiation therapy  2. followed by adjuvant tamoxifen for 5 years  Return to clinic at the end of radiation therapy for follow-up.   I discussed the assessment and treatment plan with the patient. The patient was provided an opportunity to ask questions and all were answered. The patient agreed with the plan and demonstrated an understanding of the instructions. The patient was advised to call back or seek an in-person evaluation if the symptoms worsen or if the condition fails to improve as anticipated.   I provided 15 minutes of non-face-to-face time during this encounter. Harriette Ohara, MD

## 2019-03-04 ENCOUNTER — Ambulatory Visit: Payer: BC Managed Care – PPO | Admitting: Radiation Oncology

## 2019-03-04 ENCOUNTER — Ambulatory Visit: Payer: Self-pay | Admitting: Radiation Oncology

## 2019-03-05 DIAGNOSIS — Z923 Personal history of irradiation: Secondary | ICD-10-CM

## 2019-03-05 HISTORY — DX: Personal history of irradiation: Z92.3

## 2019-03-07 DIAGNOSIS — D0512 Intraductal carcinoma in situ of left breast: Secondary | ICD-10-CM | POA: Diagnosis not present

## 2019-03-07 DIAGNOSIS — Z510 Encounter for antineoplastic radiation therapy: Secondary | ICD-10-CM | POA: Insufficient documentation

## 2019-03-10 ENCOUNTER — Ambulatory Visit
Admission: RE | Admit: 2019-03-10 | Payer: BC Managed Care – PPO | Source: Ambulatory Visit | Admitting: Radiation Oncology

## 2019-03-10 ENCOUNTER — Other Ambulatory Visit: Payer: Self-pay

## 2019-03-11 ENCOUNTER — Other Ambulatory Visit: Payer: Self-pay

## 2019-03-11 ENCOUNTER — Ambulatory Visit
Admission: RE | Admit: 2019-03-11 | Discharge: 2019-03-11 | Disposition: A | Discharge status: Home / Self Care | Payer: BC Managed Care – PPO | Source: Ambulatory Visit | Attending: Radiation Oncology | Admitting: Radiation Oncology

## 2019-03-11 DIAGNOSIS — Z510 Encounter for antineoplastic radiation therapy: Secondary | ICD-10-CM | POA: Diagnosis not present

## 2019-03-12 ENCOUNTER — Other Ambulatory Visit: Payer: Self-pay

## 2019-03-12 ENCOUNTER — Ambulatory Visit
Admission: RE | Admit: 2019-03-12 | Discharge: 2019-03-12 | Disposition: A | Discharge status: Home / Self Care | Payer: BC Managed Care – PPO | Source: Ambulatory Visit | Attending: Radiation Oncology | Admitting: Radiation Oncology

## 2019-03-12 DIAGNOSIS — D0512 Intraductal carcinoma in situ of left breast: Secondary | ICD-10-CM

## 2019-03-12 DIAGNOSIS — Z510 Encounter for antineoplastic radiation therapy: Secondary | ICD-10-CM | POA: Diagnosis not present

## 2019-03-12 MED ORDER — ALRA NON-METALLIC DEODORANT (RAD-ONC)
1.0000 "application " | Freq: Once | TOPICAL | Status: AC
  Administered 2019-03-12: 1 via TOPICAL

## 2019-03-12 MED ORDER — RADIAPLEXRX EX GEL
Freq: Once | CUTANEOUS | Status: AC
  Administered 2019-03-12: 16:00:00 via TOPICAL

## 2019-03-12 NOTE — Progress Notes (Signed)

## 2019-03-13 ENCOUNTER — Other Ambulatory Visit: Payer: Self-pay

## 2019-03-13 ENCOUNTER — Ambulatory Visit
Admission: RE | Admit: 2019-03-13 | Discharge: 2019-03-13 | Disposition: A | Discharge status: Home / Self Care | Payer: BC Managed Care – PPO | Source: Ambulatory Visit | Attending: Radiation Oncology | Admitting: Radiation Oncology

## 2019-03-13 DIAGNOSIS — Z510 Encounter for antineoplastic radiation therapy: Secondary | ICD-10-CM | POA: Diagnosis not present

## 2019-03-14 ENCOUNTER — Other Ambulatory Visit: Payer: Self-pay

## 2019-03-14 ENCOUNTER — Ambulatory Visit
Admission: RE | Admit: 2019-03-14 | Discharge: 2019-03-14 | Disposition: A | Discharge status: Home / Self Care | Payer: BC Managed Care – PPO | Source: Ambulatory Visit | Attending: Radiation Oncology | Admitting: Radiation Oncology

## 2019-03-14 DIAGNOSIS — Z510 Encounter for antineoplastic radiation therapy: Secondary | ICD-10-CM | POA: Diagnosis not present

## 2019-03-17 ENCOUNTER — Ambulatory Visit
Admission: RE | Admit: 2019-03-17 | Discharge: 2019-03-17 | Disposition: A | Discharge status: Home / Self Care | Payer: BC Managed Care – PPO | Source: Ambulatory Visit | Attending: Radiation Oncology | Admitting: Radiation Oncology

## 2019-03-17 ENCOUNTER — Other Ambulatory Visit: Payer: Self-pay

## 2019-03-17 DIAGNOSIS — Z510 Encounter for antineoplastic radiation therapy: Secondary | ICD-10-CM | POA: Diagnosis not present

## 2019-03-18 ENCOUNTER — Ambulatory Visit
Admission: RE | Admit: 2019-03-18 | Discharge: 2019-03-18 | Disposition: A | Discharge status: Home / Self Care | Payer: BC Managed Care – PPO | Source: Ambulatory Visit | Attending: Radiation Oncology | Admitting: Radiation Oncology

## 2019-03-18 DIAGNOSIS — Z510 Encounter for antineoplastic radiation therapy: Secondary | ICD-10-CM | POA: Diagnosis not present

## 2019-03-19 ENCOUNTER — Other Ambulatory Visit: Payer: Self-pay

## 2019-03-19 ENCOUNTER — Ambulatory Visit
Admission: RE | Admit: 2019-03-19 | Discharge: 2019-03-19 | Disposition: A | Discharge status: Home / Self Care | Payer: BC Managed Care – PPO | Source: Ambulatory Visit | Attending: Radiation Oncology | Admitting: Radiation Oncology

## 2019-03-19 DIAGNOSIS — Z510 Encounter for antineoplastic radiation therapy: Secondary | ICD-10-CM | POA: Diagnosis not present

## 2019-03-20 ENCOUNTER — Other Ambulatory Visit: Payer: Self-pay

## 2019-03-20 ENCOUNTER — Ambulatory Visit
Admission: RE | Admit: 2019-03-20 | Discharge: 2019-03-20 | Disposition: A | Discharge status: Home / Self Care | Payer: BC Managed Care – PPO | Source: Ambulatory Visit | Attending: Radiation Oncology | Admitting: Radiation Oncology

## 2019-03-20 DIAGNOSIS — Z510 Encounter for antineoplastic radiation therapy: Secondary | ICD-10-CM | POA: Diagnosis not present

## 2019-03-21 ENCOUNTER — Ambulatory Visit
Admission: RE | Admit: 2019-03-21 | Discharge: 2019-03-21 | Disposition: A | Discharge status: Home / Self Care | Payer: BC Managed Care – PPO | Source: Ambulatory Visit | Attending: Radiation Oncology | Admitting: Radiation Oncology

## 2019-03-21 ENCOUNTER — Other Ambulatory Visit: Payer: Self-pay

## 2019-03-21 DIAGNOSIS — Z510 Encounter for antineoplastic radiation therapy: Secondary | ICD-10-CM | POA: Diagnosis not present

## 2019-03-24 ENCOUNTER — Other Ambulatory Visit: Payer: Self-pay

## 2019-03-24 ENCOUNTER — Ambulatory Visit
Admission: RE | Admit: 2019-03-24 | Discharge: 2019-03-24 | Disposition: A | Discharge status: Home / Self Care | Payer: BC Managed Care – PPO | Source: Ambulatory Visit | Attending: Radiation Oncology | Admitting: Radiation Oncology

## 2019-03-24 ENCOUNTER — Ambulatory Visit: Payer: BC Managed Care – PPO | Admitting: Radiation Oncology

## 2019-03-24 DIAGNOSIS — Z510 Encounter for antineoplastic radiation therapy: Secondary | ICD-10-CM | POA: Diagnosis not present

## 2019-03-25 ENCOUNTER — Ambulatory Visit
Admission: RE | Admit: 2019-03-25 | Discharge: 2019-03-25 | Disposition: A | Discharge status: Home / Self Care | Payer: BC Managed Care – PPO | Source: Ambulatory Visit | Attending: Radiation Oncology | Admitting: Radiation Oncology

## 2019-03-25 ENCOUNTER — Other Ambulatory Visit: Payer: Self-pay

## 2019-03-25 DIAGNOSIS — Z510 Encounter for antineoplastic radiation therapy: Secondary | ICD-10-CM | POA: Diagnosis not present

## 2019-03-26 ENCOUNTER — Other Ambulatory Visit: Payer: Self-pay

## 2019-03-26 ENCOUNTER — Ambulatory Visit
Admission: RE | Admit: 2019-03-26 | Discharge: 2019-03-26 | Disposition: A | Discharge status: Home / Self Care | Payer: BC Managed Care – PPO | Source: Ambulatory Visit | Attending: Radiation Oncology | Admitting: Radiation Oncology

## 2019-03-26 DIAGNOSIS — Z510 Encounter for antineoplastic radiation therapy: Secondary | ICD-10-CM | POA: Diagnosis not present

## 2019-03-27 ENCOUNTER — Ambulatory Visit
Admission: RE | Admit: 2019-03-27 | Discharge: 2019-03-27 | Disposition: A | Discharge status: Home / Self Care | Payer: BC Managed Care – PPO | Source: Ambulatory Visit | Attending: Radiation Oncology | Admitting: Radiation Oncology

## 2019-03-27 ENCOUNTER — Other Ambulatory Visit: Payer: Self-pay

## 2019-03-27 DIAGNOSIS — Z510 Encounter for antineoplastic radiation therapy: Secondary | ICD-10-CM | POA: Diagnosis not present

## 2019-03-28 ENCOUNTER — Ambulatory Visit
Admission: RE | Admit: 2019-03-28 | Discharge: 2019-03-28 | Disposition: A | Discharge status: Home / Self Care | Payer: BC Managed Care – PPO | Source: Ambulatory Visit | Attending: Radiation Oncology | Admitting: Radiation Oncology

## 2019-03-28 ENCOUNTER — Other Ambulatory Visit: Payer: Self-pay

## 2019-03-28 DIAGNOSIS — Z510 Encounter for antineoplastic radiation therapy: Secondary | ICD-10-CM | POA: Diagnosis not present

## 2019-03-31 ENCOUNTER — Ambulatory Visit: Payer: BC Managed Care – PPO

## 2019-03-31 ENCOUNTER — Ambulatory Visit
Admission: RE | Admit: 2019-03-31 | Discharge: 2019-03-31 | Disposition: A | Discharge status: Home / Self Care | Payer: BC Managed Care – PPO | Source: Ambulatory Visit | Attending: Radiation Oncology | Admitting: Radiation Oncology

## 2019-03-31 ENCOUNTER — Other Ambulatory Visit: Payer: Self-pay

## 2019-03-31 DIAGNOSIS — Z510 Encounter for antineoplastic radiation therapy: Secondary | ICD-10-CM | POA: Diagnosis not present

## 2019-03-31 NOTE — Assessment & Plan Note (Deleted)
02/10/2019:Left lumpectomy: DCIS high-grade with central necrosis, margins negative, 2 cm size, ER 80%, PR 0%Tis NX stage 0 Adjuvant radiation therapy 03/12/2019- Treatment plan: Following completion of radiation she will take tamoxifen 20 mg daily x5 years  Tamoxifen counseling: We discussed the risks and benefits of tamoxifen. These include but not limited to insomnia, hot flashes, mood changes, vaginal dryness, and weight gain. Although rare, serious side effects including endometrial cancer, risk of blood clots were also discussed. We strongly believe that the benefits far outweigh the risks. Patient understands these risks and consented to starting treatment. Planned treatment duration is 5 years.  Return to clinic in 3 months for survivorship care plan visit

## 2019-04-01 ENCOUNTER — Other Ambulatory Visit: Payer: Self-pay

## 2019-04-01 ENCOUNTER — Ambulatory Visit: Payer: BC Managed Care – PPO

## 2019-04-01 ENCOUNTER — Ambulatory Visit
Admission: RE | Admit: 2019-04-01 | Discharge: 2019-04-01 | Disposition: A | Discharge status: Home / Self Care | Payer: BC Managed Care – PPO | Source: Ambulatory Visit | Attending: Radiation Oncology | Admitting: Radiation Oncology

## 2019-04-01 DIAGNOSIS — Z510 Encounter for antineoplastic radiation therapy: Secondary | ICD-10-CM | POA: Diagnosis not present

## 2019-04-02 ENCOUNTER — Ambulatory Visit
Admission: RE | Admit: 2019-04-02 | Discharge: 2019-04-02 | Disposition: A | Discharge status: Home / Self Care | Payer: BC Managed Care – PPO | Source: Ambulatory Visit | Attending: Radiation Oncology | Admitting: Radiation Oncology

## 2019-04-02 ENCOUNTER — Other Ambulatory Visit: Payer: Self-pay

## 2019-04-02 DIAGNOSIS — Z510 Encounter for antineoplastic radiation therapy: Secondary | ICD-10-CM | POA: Diagnosis not present

## 2019-04-03 ENCOUNTER — Ambulatory Visit
Admission: RE | Admit: 2019-04-03 | Discharge: 2019-04-03 | Disposition: A | Discharge status: Home / Self Care | Payer: BC Managed Care – PPO | Source: Ambulatory Visit | Attending: Radiation Oncology | Admitting: Radiation Oncology

## 2019-04-03 ENCOUNTER — Other Ambulatory Visit: Payer: Self-pay

## 2019-04-03 ENCOUNTER — Inpatient Hospital Stay: Payer: BC Managed Care – PPO | Admitting: Hematology and Oncology

## 2019-04-03 DIAGNOSIS — Z510 Encounter for antineoplastic radiation therapy: Secondary | ICD-10-CM | POA: Diagnosis not present

## 2019-04-04 ENCOUNTER — Other Ambulatory Visit: Payer: Self-pay

## 2019-04-04 ENCOUNTER — Ambulatory Visit: Payer: BC Managed Care – PPO

## 2019-04-04 ENCOUNTER — Ambulatory Visit: Payer: BC Managed Care – PPO | Admitting: Hematology and Oncology

## 2019-04-04 ENCOUNTER — Ambulatory Visit
Admission: RE | Admit: 2019-04-04 | Discharge: 2019-04-04 | Disposition: A | Discharge status: Home / Self Care | Payer: BC Managed Care – PPO | Source: Ambulatory Visit | Attending: Radiation Oncology | Admitting: Radiation Oncology

## 2019-04-04 DIAGNOSIS — Z510 Encounter for antineoplastic radiation therapy: Secondary | ICD-10-CM | POA: Diagnosis not present

## 2019-04-04 DIAGNOSIS — D0512 Intraductal carcinoma in situ of left breast: Secondary | ICD-10-CM | POA: Diagnosis not present

## 2019-04-07 ENCOUNTER — Other Ambulatory Visit: Payer: Self-pay

## 2019-04-07 ENCOUNTER — Encounter: Payer: Self-pay | Admitting: *Deleted

## 2019-04-07 ENCOUNTER — Ambulatory Visit
Admission: RE | Admit: 2019-04-07 | Discharge: 2019-04-07 | Disposition: A | Discharge status: Home / Self Care | Payer: BC Managed Care – PPO | Source: Ambulatory Visit | Attending: Radiation Oncology | Admitting: Radiation Oncology

## 2019-04-07 ENCOUNTER — Encounter: Payer: Self-pay | Admitting: Radiation Oncology

## 2019-04-07 DIAGNOSIS — Z510 Encounter for antineoplastic radiation therapy: Secondary | ICD-10-CM | POA: Diagnosis not present

## 2019-04-07 DIAGNOSIS — D0512 Intraductal carcinoma in situ of left breast: Secondary | ICD-10-CM

## 2019-04-07 MED ORDER — SONAFINE EX EMUL
1.0000 "application " | Freq: Once | CUTANEOUS | Status: AC
  Administered 2019-04-07: 1 via TOPICAL

## 2019-04-08 ENCOUNTER — Telehealth: Payer: Self-pay | Admitting: Hematology and Oncology

## 2019-04-08 NOTE — Progress Notes (Signed)
HEMATOLOGY-ONCOLOGY [place] VISIT PROGRESS NOTE  I connected with Susan Mueller on 04/09/2019 at 11:15 AM EDT by Webex video conference and verified that I am speaking with the correct person using two identifiers.  I discussed the limitations, risks, security and privacy concerns of performing an evaluation and management service by Webex and the availability of in person appointments.  I also discussed with the patient that there may be a patient responsible charge related to this service. The patient expressed understanding and agreed to proceed.  Patient's Location: Home Physician Location: Clinic  CHIEF COMPLIANT: Follow-up after radiation to discuss further treatment  INTERVAL HISTORY: Susan Mueller is a 53 y.o. female with above-mentioned history of left breast DCIS who underwent left lumpectomy and completed radiation on 04/07/19. She presents today over Webex to discuss continued treatment with antiestrogen therapy.  She has tolerated radiation very well except for mild radiation dermatitis    Ductal carcinoma in situ (DCIS) of left breast   01/15/2019 Initial Diagnosis    Screening detected left breast calcifications UOQ 8 mm: Biopsy revealed high-grade DCIS with calcifications and necrosis ER 80% strong positive, PR 0%; second group of calcifications medially?  Oil cyst biopsy pending; Tis NX stage 0    02/10/2019 Surgery    Left lumpectomy: DCIS high-grade with central necrosis, margins negative, 2 cm size, ER 80%, PR 0%Tis NX stage 0    03/12/2019 - 04/07/2019 Radiation Therapy    Adjuvant radiation therapy    04/09/2019 -  Anti-estrogen oral therapy    Tamoxifen 20 mg daily x5 years     REVIEW OF SYSTEMS:   Constitutional: Denies fevers, chills or abnormal weight loss Eyes: Denies blurriness of vision Ears, nose, mouth, throat, and face: Denies mucositis or sore throat Respiratory: Denies cough, dyspnea or wheezes Cardiovascular: Denies palpitation, chest discomfort  Gastrointestinal:  Denies nausea, heartburn or change in bowel habits Skin: Denies abnormal skin rashes Lymphatics: Denies new lymphadenopathy or easy bruising Neurological:Denies numbness, tingling or new weaknesses Behavioral/Psych: Mood is stable, no new changes  Extremities: No lower extremity edema Breast: Mild radiation dermatitis All other systems were reviewed with the patient and are negative.  Observations/Objective:  There were no vitals filed for this visit. There is no height or weight on file to calculate BMI.  I have reviewed the data as listed CMP Latest Ref Rng & Units 01/22/2019  Glucose 70 - 99 mg/dL 133(H)  BUN 6 - 20 mg/dL 12  Creatinine 0.44 - 1.00 mg/dL 0.86  Sodium 135 - 145 mmol/L 143  Potassium 3.5 - 5.1 mmol/L 3.8  Chloride 98 - 111 mmol/L 105  CO2 22 - 32 mmol/L 30  Calcium 8.9 - 10.3 mg/dL 9.4  Total Protein 6.5 - 8.1 g/dL 7.1  Total Bilirubin 0.3 - 1.2 mg/dL 0.5  Alkaline Phos 38 - 126 U/L 48  AST 15 - 41 U/L 18  ALT 0 - 44 U/L 14    Lab Results  Component Value Date   WBC 6.4 01/22/2019   HGB 13.7 01/22/2019   HCT 39.4 01/22/2019   MCV 92.1 01/22/2019   PLT 208 01/22/2019   NEUTROABS 3.8 01/22/2019      Assessment Plan:  Ductal carcinoma in situ (DCIS) of left breast 02/10/2019:Left lumpectomy: DCIS high-grade with central necrosis, margins negative, 2 cm size, ER 80%, PR 0%Tis NX stage 0 Adjuvant radiation therapy 03/12/2019-04/07/2019  Treatment plan: Adjuvant antiestrogen therapy with tamoxifen 20 mg daily x5 years  Tamoxifen counseling: We discussed the risks  and benefits of tamoxifen. These include but not limited to insomnia, hot flashes, mood changes, vaginal dryness, and weight gain. Although rare, serious side effects including endometrial cancer, risk of blood clots were also discussed. We strongly believe that the benefits far outweigh the risks. Patient understands these risks and consented to starting treatment. Planned treatment  duration is 5 years.  Return to clinic in 3 months for survivorship care plan visit      I discussed the assessment and treatment plan with the patient. The patient was provided an opportunity to ask questions and all were answered. The patient agreed with the plan and demonstrated an understanding of the instructions. The patient was advised to call back or seek an in-person evaluation if the symptoms worsen or if the condition fails to improve as anticipated.   I provided 15 minutes of face-to-face Web Ex time during this encounter.    Harriette Ohara, MD  04/09/2019    I, Molly Dorshimer, am acting as scribe for Nicholas Lose, MD.  I have reviewed the above documentation for accuracy and completeness, and I agree with the above.

## 2019-04-08 NOTE — Telephone Encounter (Signed)
Spoke with pt and she agreed to [employer] on 04/09/19. Emailed step-by-step instructions how to download and access mtg.  Will call with any questions.

## 2019-04-09 ENCOUNTER — Inpatient Hospital Stay: Payer: BC Managed Care – PPO | Attending: Hematology and Oncology | Admitting: Hematology and Oncology

## 2019-04-09 DIAGNOSIS — Z79810 Long term (current) use of selective estrogen receptor modulators (SERMs): Secondary | ICD-10-CM

## 2019-04-09 DIAGNOSIS — Z170 Estrogen receptor positive status [ER+]: Secondary | ICD-10-CM | POA: Diagnosis not present

## 2019-04-09 DIAGNOSIS — D0512 Intraductal carcinoma in situ of left breast: Secondary | ICD-10-CM | POA: Diagnosis not present

## 2019-04-09 DIAGNOSIS — L598 Other specified disorders of the skin and subcutaneous tissue related to radiation: Secondary | ICD-10-CM | POA: Insufficient documentation

## 2019-04-09 DIAGNOSIS — Z923 Personal history of irradiation: Secondary | ICD-10-CM | POA: Insufficient documentation

## 2019-04-09 DIAGNOSIS — Z79811 Long term (current) use of aromatase inhibitors: Secondary | ICD-10-CM | POA: Insufficient documentation

## 2019-04-09 DIAGNOSIS — Z780 Asymptomatic menopausal state: Secondary | ICD-10-CM | POA: Insufficient documentation

## 2019-04-09 DIAGNOSIS — Z79899 Other long term (current) drug therapy: Secondary | ICD-10-CM | POA: Insufficient documentation

## 2019-04-09 MED ORDER — TAMOXIFEN CITRATE 20 MG PO TABS: 20.0000 mg | ORAL_TABLET | Freq: Every day | ORAL | 3 refills | Status: DC

## 2019-04-09 NOTE — Assessment & Plan Note (Signed)
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 These include but not limited to insomnia, hot flashes, mood changes, vaginal dryness, and weight gain. Although rare, serious side effects including endometrial cancer, risk of blood clots were also discussed. We strongly believe that the benefits far outweigh the risks. Patient understands these risks and consented to starting treatment. Planned treatment duration is 5 years.  Return to clinic in 3 months for survivorship care plan visit

## 2019-04-10 ENCOUNTER — Telehealth: Payer: Self-pay | Admitting: Hematology and Oncology

## 2019-04-10 NOTE — Telephone Encounter (Signed)
Tried to reach °

## 2019-04-24 NOTE — Progress Notes (Signed)
  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 She experienced some expected skin irritation with erythema as she progressed through treatment. She is applying Radiaplex gel as directed and hydrocortisone cream as needed for itching. She denied having any other issues or concerns.  Plan: The patient will follow-up with radiation oncology in one month.  ________________________________________________  Eppie Gibson, MD  This document serves as a record of services personally performed by Eppie Gibson, MD. It was created on her behalf by Rae Lips, a trained medical scribe. The creation of this record is based on the scribe's personal observations and the provider's statements to them. This document has been checked and approved by the attending provider.

## 2019-04-29 ENCOUNTER — Telehealth: Payer: Self-pay | Admitting: *Deleted

## 2019-04-29 NOTE — Telephone Encounter (Signed)
Received call from pt stating she was worried about taking tamoxifen related to possible side effects.  Pt also states that she is in menopause and doesn't feel like tamoxifen is the best choice for her.  Pt wanting to speak with Dr. Lindi Adie regarding other ani estrogen therapies and the possibility of not needing to take something. Doximity apt made with Dr. Lindi Adie for tomorrow to review anti estrogen therapies.

## 2019-04-29 NOTE — Progress Notes (Signed)
HEMATOLOGY-ONCOLOGY [facility] VISIT PROGRESS NOTE  I connected with Susan Mueller on 04/30/2019 at 10:30 AM EDT by Doximity video conference and verified that I am speaking with the correct person using two identifiers.  I discussed the limitations, risks, security and privacy concerns of performing an evaluation and management service by [facility] and the availability of in person appointments.  I also discussed with the patient that there may be a patient responsible charge related to this service. The patient expressed understanding and agreed to proceed.  Patient's Location: Home Physician Location: Clinic  CHIEF COMPLIANT: Follow-up to discuss antiestrogen therapy options  INTERVAL HISTORY: Susan Mueller is a 53 y.o. female with above-mentioned history of DCIS of the left breast treated with lumpectomy, radiation and who is currently on antiestrogen therapy with tamoxifen. She presents over Doximity today to discuss anti-estrogen therapy options, as she is concerned about the side effects of tamoxifen and is in menopause.     Ductal carcinoma in situ (DCIS) of left breast   01/15/2019 Initial Diagnosis    Screening detected left breast calcifications UOQ 8 mm: Biopsy revealed high-grade DCIS with calcifications and necrosis ER 80% strong positive, PR 0%; second group of calcifications medially?  Oil cyst biopsy pending; Tis NX stage 0    02/10/2019 Surgery    Left lumpectomy: DCIS high-grade with central necrosis, margins negative, 2 cm size, ER 80%, PR 0%Tis NX stage 0    03/12/2019 - 04/07/2019 Radiation Therapy    Adjuvant radiation therapy    04/09/2019 -  Anti-estrogen oral therapy    Tamoxifen 20 mg daily x5 years     REVIEW OF SYSTEMS:   Constitutional: Denies fevers, chills or abnormal weight loss Eyes: Denies blurriness of vision Ears, nose, mouth, throat, and face: Denies mucositis or sore throat Respiratory: Denies cough, dyspnea or wheezes Cardiovascular: Denies  palpitation, chest discomfort Gastrointestinal:  Denies nausea, heartburn or change in bowel habits Skin: Denies abnormal skin rashes Lymphatics: Denies new lymphadenopathy or easy bruising Neurological:Denies numbness, tingling or new weaknesses Behavioral/Psych: Mood is stable, no new changes  Extremities: No lower extremity edema Breast: denies any pain or lumps or nodules in either breasts All other systems were reviewed with the patient and are negative.  Observations/Objective:  There were no vitals filed for this visit. There is no height or weight on file to calculate BMI.  I have reviewed the data as listed CMP Latest Ref Rng & Units 01/22/2019  Glucose 70 - 99 mg/dL 133(H)  BUN 6 - 20 mg/dL 12  Creatinine 0.44 - 1.00 mg/dL 0.86  Sodium 135 - 145 mmol/L 143  Potassium 3.5 - 5.1 mmol/L 3.8  Chloride 98 - 111 mmol/L 105  CO2 22 - 32 mmol/L 30  Calcium 8.9 - 10.3 mg/dL 9.4  Total Protein 6.5 - 8.1 g/dL 7.1  Total Bilirubin 0.3 - 1.2 mg/dL 0.5  Alkaline Phos 38 - 126 U/L 48  AST 15 - 41 U/L 18  ALT 0 - 44 U/L 14    Lab Results  Component Value Date   WBC 6.4 01/22/2019   HGB 13.7 01/22/2019   HCT 39.4 01/22/2019   MCV 92.1 01/22/2019   PLT 208 01/22/2019   NEUTROABS 3.8 01/22/2019      Assessment Plan:  Ductal carcinoma in situ (DCIS) of left breast 02/10/2019: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 I reviewed the [facility] DCIS nomogram which predicts without antiestrogen therapy her 10-year risk of recurrence at 6%.  With antiestrogen therapy is a 3%.  Plan: 1.  Check to see if she is in menopause with [place] and estradiol today 2.  She is in menopause patient is interested in antiestrogen therapy  with anastrozole 1 mg daily.  Anastrozole counseling: We discussed the risks and benefits of anti-estrogen therapy with aromatase inhibitors. These include but not limited to insomnia, hot flashes, mood changes, vaginal dryness, bone density loss, and weight gain. We strongly believe that the benefits far outweigh the risks. Patient understands these risks and consented to starting treatment. Planned treatment duration is 5 years.  I will call her with the results of the menopause status and decide which treatment she wishes to go on.  I also discussed the TAM-01 clinical trial where the use even a 5 mg dosage of tamoxifen for benefit in DCIS.  Whenever treatment she chooses, she will come back in 3 months for survivorship care plan visit.     I discussed the assessment and treatment plan with the patient. The patient was provided an opportunity to ask questions and all were answered. The patient agreed with the plan and demonstrated an understanding of the instructions. The patient was advised to call back or seek an in-person evaluation if the symptoms worsen or if the condition fails to improve as anticipated.   I provided 15 minutes of face-to-face Doximity time during this encounter.    Rulon Eisenmenger, MD 04/30/2019   I, Molly Dorshimer, am acting as scribe for Nicholas Lose, MD.  I have reviewed the above documentation for accuracy and completeness, and I agree with the above.

## 2019-04-30 ENCOUNTER — Inpatient Hospital Stay: Payer: BC Managed Care – PPO

## 2019-04-30 ENCOUNTER — Inpatient Hospital Stay (HOSPITAL_BASED_OUTPATIENT_CLINIC_OR_DEPARTMENT_OTHER): Payer: BC Managed Care – PPO | Admitting: Hematology and Oncology

## 2019-04-30 ENCOUNTER — Other Ambulatory Visit: Payer: Self-pay

## 2019-04-30 DIAGNOSIS — Z79810 Long term (current) use of selective estrogen receptor modulators (SERMs): Secondary | ICD-10-CM | POA: Diagnosis not present

## 2019-04-30 DIAGNOSIS — D0512 Intraductal carcinoma in situ of left breast: Secondary | ICD-10-CM

## 2019-04-30 DIAGNOSIS — Z170 Estrogen receptor positive status [ER+]: Secondary | ICD-10-CM | POA: Diagnosis not present

## 2019-04-30 DIAGNOSIS — Z780 Asymptomatic menopausal state: Secondary | ICD-10-CM | POA: Diagnosis not present

## 2019-04-30 DIAGNOSIS — Z923 Personal history of irradiation: Secondary | ICD-10-CM | POA: Diagnosis not present

## 2019-04-30 DIAGNOSIS — Z79899 Other long term (current) drug therapy: Secondary | ICD-10-CM | POA: Diagnosis not present

## 2019-04-30 DIAGNOSIS — L598 Other specified disorders of the skin and subcutaneous tissue related to radiation: Secondary | ICD-10-CM | POA: Diagnosis not present

## 2019-04-30 DIAGNOSIS — Z79811 Long term (current) use of aromatase inhibitors: Secondary | ICD-10-CM | POA: Diagnosis not present

## 2019-04-30 NOTE — Assessment & Plan Note (Addendum)
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 I reviewed the [facility] DCIS nomogram which predicts without antiestrogen therapy her 10-year risk of recurrence at 6%.  With antiestrogen therapy is a 3%.  Plan: 1.  Check to see if she is in menopause with [place] and estradiol today 2.  She is in menopause patient is interested in antiestrogen therapy with anastrozole 1 mg daily.  Anastrozole counseling: We discussed the risks and benefits of anti-estrogen therapy with aromatase inhibitors. These include but not limited to insomnia, hot flashes, mood changes, vaginal dryness, bone density loss, and weight gain. We strongly believe that the benefits far outweigh the risks. Patient understands these risks and consented to starting treatment. Planned treatment duration is 5 years.  I will call her with the results of the menopause status and decide which treatment she wishes to go on.  I also discussed the TAM-01 clinical trial where the use even a 5 mg dosage of tamoxifen for benefit in DCIS.  Whenever treatment she chooses, she will come back in 3 months for survivorship care plan visit.

## 2019-05-01 LAB — FOLLICLE STIMULATING HORMONE: FSH: 113.8 m[IU]/mL

## 2019-05-06 LAB — ESTRADIOL, ULTRA SENS: Estradiol, Sensitive: <2.5 pg/mL

## 2019-05-07 ENCOUNTER — Encounter: Payer: Self-pay | Admitting: Radiation Oncology

## 2019-05-07 ENCOUNTER — Other Ambulatory Visit: Payer: Self-pay

## 2019-05-07 ENCOUNTER — Telehealth: Payer: Self-pay | Admitting: Hematology and Oncology

## 2019-05-07 MED ORDER — ANASTROZOLE 1 MG PO TABS: 1.0000 mg | ORAL_TABLET | Freq: Every day | ORAL | 1 refills | Status: DC

## 2019-05-07 NOTE — Progress Notes (Signed)
I called the patient today about her upcoming follow-up appointment in radiation oncology.   Given concerns about the COVID-19 pandemic, I offered a phone assessment with the patient to determine if coming to the clinic was necessary. She accepted.  I let the patient know that I had spoken with Dr. Isidore Moos, and she wanted them to know the importance of washing their hands for at least 20 seconds at a time, especially after going out in public, and before they eat.  Limit going out in public whenever possible. Do not touch your face, unless your hands are clean, such as when bathing. Get plenty of rest, eat well, and stay hydrated.   The patient denies any symptomatic concerns.  Specifically, they report good healing of their skin in the radiation fields.  Skin is intact.    I recommended that she continue skin care by applying oil or lotion with vitamin E to the skin in the radiation fields, BID, for 2 more months.  Continue follow-up with medical oncology - follow-up is scheduled on 07/18/19 with survivorship NP, Wilber Bihari .  I explained that yearly mammograms are important for patients with intact breast tissue, and physical exams are important after mastectomy for patients that cannot undergo mammography.  I encouraged her to call if she had further questions or concerns about her healing. Otherwise, she will follow-up PRN in radiation oncology. Patient is pleased with this plan, and we will cancel her upcoming follow-up to reduce the risk of COVID-19 transmission.

## 2019-05-07 NOTE — Telephone Encounter (Signed)
Telephone call Patients labs showed that shes in menopause. Anastrozole prescription sent

## 2019-05-09 ENCOUNTER — Inpatient Hospital Stay
Admission: RE | Admit: 2019-05-09 | Discharge: 2019-05-09 | Disposition: A | Discharge status: Home / Self Care | Payer: Self-pay | Source: Ambulatory Visit | Attending: Radiation Oncology | Admitting: Radiation Oncology

## 2019-05-09 HISTORY — DX: Personal history of irradiation: Z92.3

## 2019-06-23 ENCOUNTER — Telehealth: Payer: Self-pay | Admitting: Adult Health

## 2019-06-23 NOTE — Telephone Encounter (Signed)
I left a message regarding reschedule °

## 2019-07-18 ENCOUNTER — Encounter: Payer: BC Managed Care – PPO | Admitting: Adult Health

## 2019-08-04 ENCOUNTER — Telehealth: Payer: Self-pay | Admitting: Adult Health

## 2019-08-04 NOTE — Telephone Encounter (Signed)
I left a message regarding video visit also my chart support number. Patient should also call if she is unable to become active before appointment

## 2019-08-18 ENCOUNTER — Telehealth: Payer: Self-pay | Admitting: Hematology and Oncology

## 2019-08-19 ENCOUNTER — Inpatient Hospital Stay: Payer: BC Managed Care – PPO | Attending: Adult Health | Admitting: Adult Health

## 2019-08-19 DIAGNOSIS — N952 Postmenopausal atrophic vaginitis: Secondary | ICD-10-CM | POA: Diagnosis not present

## 2019-08-19 DIAGNOSIS — D0512 Intraductal carcinoma in situ of left breast: Secondary | ICD-10-CM | POA: Diagnosis not present

## 2019-08-19 NOTE — Progress Notes (Signed)
SURVIVORSHIP VIRTUAL VISIT:  I connected with Susan Mueller on 08/19/19 at  2:00 PM EDT by my chart video and verified that I am speaking with the correct person using two identifiers.  I discussed the limitations, risks, security and privacy concerns of performing an evaluation and management service virtually and the availability of in person appointments. I also discussed with the patient that there may be a patient responsible charge related to this service. The patient expressed understanding and agreed to proceed.   BRIEF ONCOLOGIC HISTORY:  Oncology History  Ductal carcinoma in situ (DCIS) of left breast  01/15/2019 Initial Diagnosis   Screening detected left breast calcifications UOQ 8 mm: Biopsy revealed high-grade DCIS with calcifications and necrosis ER 80% strong positive, PR 0%; second group of calcifications medially?  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     Overall, Susan Mueller reports feeling quite well. She has opted to not take the anastrozole due to her decreased risk of recurrence after reviewing the [facility] risk nomogram.  She has some vaginal dryness and atrophy.  She was cleared by Dr. Lindi Adie to use Susan Mueller.  She wants my thoughts on this, and what else she might be able to take.    REVIEW OF SYSTEMS:  Review of Systems  Constitutional: Negative for appetite change, chills, fatigue and fever.  HENT:   Negative for hearing loss,  lump/mass, sore throat and trouble swallowing.   Eyes: Negative for eye problems and icterus.  Respiratory: Negative for chest tightness, cough and shortness of breath.   Cardiovascular: Negative for chest pain, leg swelling and palpitations.  Gastrointestinal: Negative for abdominal distention, abdominal pain, constipation, diarrhea, nausea and vomiting.  Endocrine: Negative for hot flashes.  Musculoskeletal: Negative for arthralgias.  Skin: Negative for itching and rash.  Neurological: Negative for dizziness, extremity weakness, headaches and numbness.  Hematological: Negative for adenopathy. Does not bruise/bleed easily.  Psychiatric/Behavioral: Negative for depression. The patient is not nervous/anxious.   Breast: Denies any new nodularity, masses, tenderness, nipple changes, or nipple discharge.      ONCOLOGY TREATMENT TEAM:  1. Surgeon:  Dr. Dalbert Batman at [facility] 2. Medical Oncologist: Dr. Lindi Adie  3. Radiation Oncologist: Dr. Isidore Moos    PAST MEDICAL/SURGICAL HISTORY:  Past Medical History:  Diagnosis Date  . Asthma   . History of radiation therapy 03/11/19- 04/07/19   Left Breast 15 fractions followed by a left breast boost of 5 fractions.   . Migraines   . Mitral valve prolapse    Past Surgical History:  Procedure Laterality Date  . arm surgery Left 53 yrs old   broken elbow  . BREAST LUMPECTOMY WITH RADIOACTIVE SEED LOCALIZATION Left 02/10/2019   Procedure: LEFT BREAST LUMPECTOMY WITH RADIOACTIVE SEED LOCALIZATION;  Surgeon: Fanny Skates, MD;  Location: [place] [facility];  Service: General;  Laterality: Left;  . DILATION AND CURETTAGE OF UTERUS  2005     ALLERGIES:  Allergies  Allergen Reactions  . Avelox [  Moxifloxacin Hcl In Nacl] Other (See Comments)    Numbness in face and rash in mouth  . Codeine Nausea Only  . Septra [Sulfamethoxazole-Trimethoprim] Rash     CURRENT MEDICATIONS:  Outpatient Encounter Medications as of 08/19/2019   Medication Sig  . albuterol (PROAIR HFA) 108 (90 Base) MCG/ACT inhaler Inhale 2 puffs into the lungs every 4 (four) hours as needed for wheezing or shortness of breath.  . anastrozole (ARIMIDEX) 1 MG tablet Take 1 tablet (1 mg total) by mouth daily.  . budesonide (PULMICORT FLEXHALER) 180 MCG/ACT inhaler Inhale 2 puffs into the lungs 2 (two) times daily.  Marland Kitchen HYDROcodone-acetaminophen (NORCO) 5-325 MG tablet Take 1-2 tablets by mouth every 6 (six) hours as needed for moderate pain or severe pain. (Patient not taking: Reported on 02/21/2019)  . Multiple Vitamins-Minerals (MULTIVITAMIN WITH MINERALS) tablet Take 1 tablet by mouth daily.   No facility-administered encounter medications on file as of 08/19/2019.      ONCOLOGIC FAMILY HISTORY:  Family History  Problem Relation Age of Onset  . Hypercholesterolemia Sister        elevated triglycerides     GENETIC COUNSELING/TESTING: Not at this time  SOCIAL HISTORY:  Social History   Socioeconomic History  . Marital status: Married    Spouse name: Archie  . Number of children: Not on file  . Years of education: Not on file  . Highest education level: Not on file  Occupational History  . Not on file  Social Needs  . Financial resource strain: Not on file  . Food insecurity    Worry: Not on file    Inability: Not on file  . Transportation needs    Medical: No    Non-medical: No  Tobacco Use  . Smoking status: Never Smoker  . Smokeless tobacco: Never Used  Substance and Sexual Activity  . Alcohol use: Yes    Comment: rarely  . Drug use: No  . Sexual activity: Not on file  Lifestyle  . Physical activity    Days per week: Not on file    Minutes per session: Not on file  . Stress: Not on file  Relationships  . Social Herbalist on phone: Not on file    Gets together: Not on file    Attends religious service: Not on file    Active member of club or organization: Not on file    Attends meetings of clubs or  organizations: Not on file    Relationship status: Not on file  . Intimate partner violence    Fear of current or ex partner: No    Emotionally abused: No    Physically abused: No    Forced sexual activity: No  Other Topics Concern  . Not on file  Social History Narrative  . Not on file     OBSERVATIONS/OBJECTIVE:   Patient appears well.  She is in no apparent distress.  Skin visualized without rash or lesion.  Breathing is non labored.  Mood and behavior are normal.    LABORATORY DATA:  None for this visit.  DIAGNOSTIC IMAGING:  None for this visit.      ASSESSMENT AND PLAN:  Ms.. Mueller is a pleasant 53 y.o. female with Stage 0 left breast DCIS, ER+/PR-, diagnosed in 2/112/2020, treated with lumpectomy, adjuvant radiation therapy, and opted against anti-estrogen therapy.  She presents to the Survivorship Clinic for our initial meeting and routine follow-up post-completion of treatment for breast cancer.  1. Stage 0 left breast cancer:  Susan Mueller is continuing to recover from definitive treatment for breast cancer. She will follow-up with her medical oncologist, Dr. Lindi Adie in 03/2020 with history and physical exam per surveillance protocol.  She has decided that after reviewing the risks and benefits of Anastrozole, that she does not want to take the medication.  She understands that this will increase her risk for another breast cancer.  Today, a comprehensive survivorship care plan and treatment summary was reviewed with the patient today detailing her breast cancer diagnosis, treatment course, potential late/long-term effects of treatment, appropriate follow-up care with recommendations for the future, and patient education resources.  A copy of this summary, along with a letter will be sent to the patient's primary care provider via mail/fax/In Basket message after today's visit.    2. Vaginal Dryness: We talked about this in detail.  We discussed Replens, Vitamin E  suppositories, and a pelvic PT referral.  I placed a referral.  She and I reviewed intrarosa, and I recommended that she use it three times a week and see PT and get their input on the vaginal atrophy.    3. Bone health:  Given Susan Mueller's history of breast cancer, she is at slight risk for bone demineralization.   She was given education on specific activities to promote bone health.  4. Cancer screening:  Due to Susan Mueller's history and her age, she should receive screening for skin cancers, colon cancer, and gynecologic cancers.  The information and recommendations are listed on the patient's comprehensive care plan/treatment summary and were reviewed in detail with the patient.    5. Health maintenance and wellness promotion: Susan Mueller was encouraged to consume 5-7 servings of fruits and vegetables per day. We reviewed the "Nutrition Rainbow" handout, as well as the handout "Take Control of Your Health and Reduce Your Cancer Risk" from the [place].  She was also encouraged to engage in moderate to vigorous exercise for 30 minutes per day most days of the week. We discussed the [employer] fitness program, which is designed for cancer survivors to help them become more physically fit after cancer treatments.  She was instructed to limit her alcohol consumption and continue to abstain from tobacco use.    6. Support services/counseling: It is not uncommon for this period of the patient's cancer care trajectory to be one of many emotions and stressors.  We discussed how this can be increasingly difficult during the times of quarantine and social distancing due to the COVID-19 pandemic.   She was given information regarding our available services and encouraged to contact me with any questions or for help enrolling in any of our support group/programs.    Follow up instructions:    -Return to cancer center in 03/2020 for f/u with Dr. Lindi Adie  -Mammogram due in 01/2020 -Follow up with  surgery 09/2019 -referral to pelvic rehab -She is welcome to return back to the Survivorship Clinic at any time; no additional follow-up needed at this time.  -Consider referral back to survivorship as a long-term survivor for continued surveillance  The patient was provided an opportunity to ask questions and all were answered. The patient agreed with the plan and demonstrated an understanding of the instructions.   The patient was advised to call back or seek an in-person evaluation if the symptoms worsen or if the condition fails to improve as anticipated.   I provided 25 minutes of face-to-face video visit time during  this encounter, and > 50% was spent counseling as documented under my assessment & plan.  Scot Dock, NP

## 2019-08-20 ENCOUNTER — Encounter: Payer: Self-pay | Admitting: Adult Health

## 2019-08-29 ENCOUNTER — Encounter: Payer: Self-pay | Admitting: *Deleted

## 2019-09-16 ENCOUNTER — Encounter: Payer: Self-pay | Admitting: Physical Therapy

## 2019-09-16 ENCOUNTER — Other Ambulatory Visit: Payer: Self-pay

## 2019-09-16 ENCOUNTER — Ambulatory Visit: Payer: BC Managed Care – PPO | Attending: Adult Health | Admitting: Physical Therapy

## 2019-09-16 DIAGNOSIS — R279 Unspecified lack of coordination: Secondary | ICD-10-CM | POA: Diagnosis present

## 2019-09-16 DIAGNOSIS — R252 Cramp and spasm: Secondary | ICD-10-CM | POA: Diagnosis present

## 2019-09-16 DIAGNOSIS — M6281 Muscle weakness (generalized): Secondary | ICD-10-CM | POA: Diagnosis not present

## 2019-09-16 NOTE — Patient Instructions (Addendum)
Moisturizers . They are used in the vagina to hydrate the mucous membrane that make up the vaginal canal. . Designed to keep a more normal acid balance (ph) . Once placed in the vagina, it will last between two to three days.  . Use 2-3 times per week at bedtime  . Ingredients to avoid is glycerin and fragrance, can increase chance of infection . Should not be used just before sex due to causing irritation . Most are gels administered either in a tampon-shaped applicator or as a vaginal suppository. They are non-hormonal.   Types of Moisturizers  . Vitamin E vaginal suppositories- Whole foods, Amazon . Moist Again . Coconut oil- can break down condoms . Julva- (Do no use if on Tamoxifen) amazon . Yes moisturizer- amazon . NeuEve Silk , NeuEve Silver for menopausal or over 65 (if have severe vaginal atrophy or cancer treatments use NeuEve Silk for  1 month than move to [employer])- [employer], MapleFlower.dk . Olive and Bee intimate cream- www.oliveandbee.com.au . Mae vaginal Wilder . Aloe   Creams to use externally on the Vulva area  Albertson's (good for for cancer patients that had radiation to the area)- [place] or [employer].FlyingBasics.com.br  V-magic cream - amazon  Julva-amazon  Vital "V Wild Yam salve ( help moisturize and help with thinning vulvar area, does have Canova by Irwin Brakeman labial moisturizer ([place],   Coconut or olive oil  aloe   Things to avoid in the vaginal area . Do not use things to irritate the vulvar area . No lotions just specialized creams for the vulva area- Neogyn, V-magic, No soaps; can use Aveeno or Calendula cleanser if needed. Must be gentle . No deodorants . No douches . Good to sleep without underwear to let the vaginal area to air out . No scrubbing: spread the lips to let warm water rinse over labias and pat dry Access Code: [email or web address]  URL:  [email or web address]  Date: 09/16/2019  Prepared by: Jari Favre   Exercises  Supine Diaphragmatic Breathing with Pelvic Floor Lengthening - 10 reps - 1 sets - 3x daily - 7x weekly  Supine Piriformis Stretch - 3 reps - 1 sets - 30 sec hold - 1x daily - 7x weekly  Supine Hip Internal and External Rotation - 10 reps - 1 sets - 10 sec hold - 1x daily - 7x weekly

## 2019-09-16 NOTE — Therapy (Signed)
[facility] [address] [place], [place], [address] Phone: [phone]   Fax:  [phone]  Physical Therapy Evaluation  Patient Details  Name: Susan Mueller MRN: [id number] Date of Birth: Aug 05, 1966 Referring Provider (PT): Gardenia Phlegm, NP   Encounter Date: 09/16/2019  PT End of Session - 09/16/19 1158    Visit Number  1    Date for PT Re-Evaluation  12/09/19    PT Start Time  [id number]    PT Stop Time  1236    PT Time Calculation (min)  51 min    Activity Tolerance  Patient tolerated treatment well    Behavior During Therapy  [facility] for tasks assessed/performed       Past Medical History:  Diagnosis Date  . Asthma   . History of radiation therapy 03/11/19- 04/07/19   Left Breast 15 fractions followed by a left breast boost of 5 fractions.   . Migraines   . Mitral valve prolapse     Past Surgical History:  Procedure Laterality Date  . arm surgery Left 53 yrs old   broken elbow  . BREAST LUMPECTOMY WITH RADIOACTIVE SEED LOCALIZATION Left 02/10/2019   Procedure: LEFT BREAST LUMPECTOMY WITH RADIOACTIVE SEED LOCALIZATION;  Surgeon: Fanny Skates, MD;  Location: [place];  Service: General;  Laterality: Left;  . DILATION AND CURETTAGE OF UTERUS  2005    There were no vitals filed for this visit.   Subjective Assessment - 09/16/19 1154    Subjective  Pt was trying intrarosa, but had estrogen receptor positive breast CA.  Pt has been having weakness and pain on the Rt side.    Pertinent History  02/2019 lumpectomy, 4 weeks of chemo after that    Patient Stated Goals  be able to have intercourse without pain and no pain    Currently in Pain?  Yes    Pain Score  1     Pain Location  Vagina    Pain Orientation  Right    Pain Descriptors / Indicators  Discomfort         [facility] PT Assessment - 09/16/19 0001      Assessment   Medical Diagnosis  N95.2 (ICD-10-CM) - Vaginal atrophy     Referring Provider (PT)  Gardenia Phlegm, NP    Onset Date/Surgical Date  --   03/2019   Prior Therapy  No      Precautions   Precautions  None      Restrictions   Weight Bearing Restrictions  No      Balance Screen   Has the patient fallen in the past 6 months  No      [place] residence    Living Arrangements  Spouse/significant other      Prior Function   Level of Independence  Independent    Vocation  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     Leisure  pilates, used to jog but hurt foot      Cognition   Overall Cognitive Status  Within Functional Limits for tasks assessed      Posture/Postural Control   Posture/Postural Control  No significant limitations      ROM / Strength   AROM / PROM / Strength  AROM;PROM;Strength      AROM   Overall AROM Comments  lumbar flexion 25% limited  PROM   Overall PROM Comments  Rt hip rotation 50% limited, Lt hip rotation 25% limited      Strength   Overall Strength Comments  Rt LE 4+/5      Flexibility   Soft Tissue Assessment /Muscle Length  yes    Hamstrings  80%      Ambulation/Gait   Gait Pattern  Within Functional Limits                Objective measurements completed on examination: See above findings.    Pelvic Floor Special Questions - 09/16/19 0001    Prior Pelvic/Prostate Exam  Yes    Prior Pregnancies  Yes    Number of Pregnancies  2    Number of Vaginal Deliveries  1   miscarriage 1x   Currently Sexually Active  Yes    Is this Painful  Yes    Urinary Leakage  No    Urinary urgency  Yes   few drops right before using the rest room   Fluid intake  about 32 oz water    Falling out feeling (prolapse)  No    External Perineal Exam  pt identity confirmed and informed consent given to perform internal soft tissue assessment    Skin Integrity  Intact    Perineal Body/Introitus   Descended     Prolapse  None    Exam Type  Vaginal    Palpation  higher tone on Lt side, weakness and TTP Rt>Lt    Strength  fair squeeze, definite lift    Strength # of reps  3    Strength # of seconds  1    Tone  low               PT Education - 09/16/19 1218    Education Details  [id number]          PT Long Term Goals - 09/16/19 1157      PT LONG TERM GOAL #1   Title  Be able to have intercourse with 1/3 on Marinoff scale    Baseline  3/3 marinoff    Time  12    Period  Weeks    Status  New    Target Date  12/09/19      PT LONG TERM GOAL #2   Title  Pt will be able to demonstrate 3/5 MMT and hold for 10 seconds for improved bladder control    Time  12    Period  Weeks    Status  New    Target Date  12/09/19      PT LONG TERM GOAL #3   Title  Pt will be ind with advnaced HEP    Time  12    Period  Weeks    Status  New    Target Date  12/09/19      PT LONG TERM GOAL #4   Title  Pt will report she is not voiding just in case due to feeling improved strength of pelvic floor    Time  12    Period  Weeks    Status  New    Target Date  12/09/19             Plan - 09/16/19 1219    Clinical Impression Statement  Pt presents to skilled PT due to vaginal atrophy and dryness preventing her from having intercourse and causing general low level pain throughout the day.  She is also experiencing urinary  frequency and mild incontinence.  Pt has decreased hip ROM Rt>Lt and limited lumbar flexion.  She has about 80% flexibility of bilateral hamstrings.  Pt has tenderness of her pelvic floor Rt>Lt and palpable muscle atrophy.  Rt levator and ischiocavernosis muscles cannot hold against any resistance.  Pt will benefit from skilled PT to address these impairments and teach her ways to better manage her pain so she can return to maximum functional activities.    Personal Factors and Comorbidities  Comorbidity 2    Comorbidities  postmenopaus, history of breast cancer estrogen  receptor positive    Examination-Activity Limitations  Other;Sit;Continence    Examination-Participation Restrictions  Interpersonal Relationship    Stability/Clinical Decision Making  Evolving/Moderate complexity    Clinical Decision Making  Moderate    Rehab Potential  Excellent    PT Frequency  1x / week    PT Duration  12 weeks    PT Treatment/Interventions  ADLs/Self Care Home Management;Biofeedback;Cryotherapy;Electrical Stimulation;Iontophoresis 4mg /ml Dexamethasone;Moist Heat;Therapeutic exercise;Therapeutic activities;Neuromuscular re-education;Patient/family education;Manual techniques;Taping;Dry needling;Passive range of motion    PT Next Visit Plan  hip and lumbar stretch, kegel lying down side, prone, supine, biofeedback, STM to glutes and lumbar    PT Home Exercise Plan  [id number]    Consulted and Agree with Plan of Care  Patient       Patient will benefit from skilled therapeutic intervention in order to improve the following deficits and impairments:  Pain, Increased fascial restricitons, Decreased strength, Decreased coordination, Impaired flexibility, Decreased range of motion, Increased muscle spasms, Decreased endurance  Visit Diagnosis: Muscle weakness (generalized)  Unspecified lack of coordination  Cramp and spasm     Problem List Patient Active Problem List   Diagnosis Date Noted  . 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 Asthma 08/24/2015  . Allergic rhinoconjunctivitis 08/24/2015  . Mitral valve disorders(424.0) 11/21/2013    Jule Ser, PT 09/16/2019, 5:51 PM  [facility] [address], [place], [place], [address] Phone: [phone]   Fax:  [phone]  Name: PARICIA SHULTIS MRN: [id number] Date of Birth: May 12, 1966

## 2019-09-23 ENCOUNTER — Ambulatory Visit: Payer: BC Managed Care – PPO | Admitting: Physical Therapy

## 2019-09-30 ENCOUNTER — Encounter: Payer: BC Managed Care – PPO | Admitting: Physical Therapy

## 2019-10-01 ENCOUNTER — Other Ambulatory Visit (HOSPITAL_COMMUNITY): Payer: Self-pay | Admitting: Gastroenterology

## 2019-10-01 ENCOUNTER — Other Ambulatory Visit: Payer: Self-pay | Admitting: Gastroenterology

## 2019-10-01 DIAGNOSIS — R1011 Right upper quadrant pain: Secondary | ICD-10-CM

## 2019-10-07 ENCOUNTER — Ambulatory Visit: Payer: BC Managed Care – PPO | Admitting: Physical Therapy

## 2019-10-09 ENCOUNTER — Ambulatory Visit (HOSPITAL_COMMUNITY)
Admission: RE | Admit: 2019-10-09 | Discharge: 2019-10-09 | Disposition: A | Discharge status: Home / Self Care | Payer: BC Managed Care – PPO | Source: Ambulatory Visit | Attending: Gastroenterology | Admitting: Gastroenterology

## 2019-10-09 ENCOUNTER — Other Ambulatory Visit: Payer: Self-pay

## 2019-10-09 DIAGNOSIS — R1011 Right upper quadrant pain: Secondary | ICD-10-CM | POA: Diagnosis present

## 2019-10-13 ENCOUNTER — Other Ambulatory Visit (HOSPITAL_COMMUNITY): Payer: Self-pay | Admitting: Gastroenterology

## 2019-10-13 ENCOUNTER — Other Ambulatory Visit: Payer: Self-pay | Admitting: Gastroenterology

## 2019-10-13 DIAGNOSIS — R1011 Right upper quadrant pain: Secondary | ICD-10-CM

## 2019-10-14 ENCOUNTER — Encounter: Payer: BC Managed Care – PPO | Admitting: Physical Therapy

## 2019-10-23 ENCOUNTER — Encounter (HOSPITAL_COMMUNITY)
Admission: RE | Admit: 2019-10-23 | Discharge: 2019-10-23 | Disposition: A | Discharge status: Home / Self Care | Payer: BC Managed Care – PPO | Source: Ambulatory Visit | Attending: Gastroenterology | Admitting: Gastroenterology

## 2019-10-23 ENCOUNTER — Other Ambulatory Visit: Payer: Self-pay

## 2019-10-23 DIAGNOSIS — R1011 Right upper quadrant pain: Secondary | ICD-10-CM | POA: Diagnosis present

## 2019-10-23 MED ORDER — TECHNETIUM TC 99M MEBROFENIN IV KIT
5.0000 | PACK | Freq: Once | INTRAVENOUS | Status: AC | PRN
  Administered 2019-10-23: 11:00:00 5 via INTRAVENOUS

## 2019-11-11 ENCOUNTER — Encounter: Payer: Self-pay | Admitting: *Deleted

## 2019-12-01 ENCOUNTER — Encounter: Payer: Self-pay | Admitting: *Deleted

## 2020-01-21 ENCOUNTER — Ambulatory Visit
Admission: RE | Admit: 2020-01-21 | Discharge: 2020-01-21 | Disposition: A | Discharge status: Home / Self Care | Payer: BC Managed Care – PPO | Source: Ambulatory Visit | Attending: Adult Health | Admitting: Adult Health

## 2020-01-21 ENCOUNTER — Other Ambulatory Visit: Payer: Self-pay

## 2020-01-21 DIAGNOSIS — N952 Postmenopausal atrophic vaginitis: Secondary | ICD-10-CM

## 2020-01-21 DIAGNOSIS — D0512 Intraductal carcinoma in situ of left breast: Secondary | ICD-10-CM

## 2020-02-17 IMAGING — MG NEEDLE LOCALIZATION OF THE LEFT BREAST WITH MAMMO GUIDANCE
8 series · 8 of 8 positions shown · non-contrast
Comparison: Previous exam(s).

CLINICAL DATA: Biopsy-proven high-grade DCIS involving the UPPER
OUTER QUADRANT of the LEFT breast at MIDDLE to POSTERIOR depth (coil
clip placed at the time of biopsy). Benign biopsy of calcifications
in the INNER LEFT breast at MIDDLE depth with pathology revealing
fat necrosis (X shaped clip placed at the time of biopsy).

The coil shaped tissue marker clip associated with the biopsy-proven
DCIS is localized in anticipation of lumpectomy on 02/10/2019.
EXAM:
MAMMOGRAPHIC GUIDED RADIOACTIVE SEED LOCALIZATION OF THE LEFT BREAST

[L CC (1 of 4)]
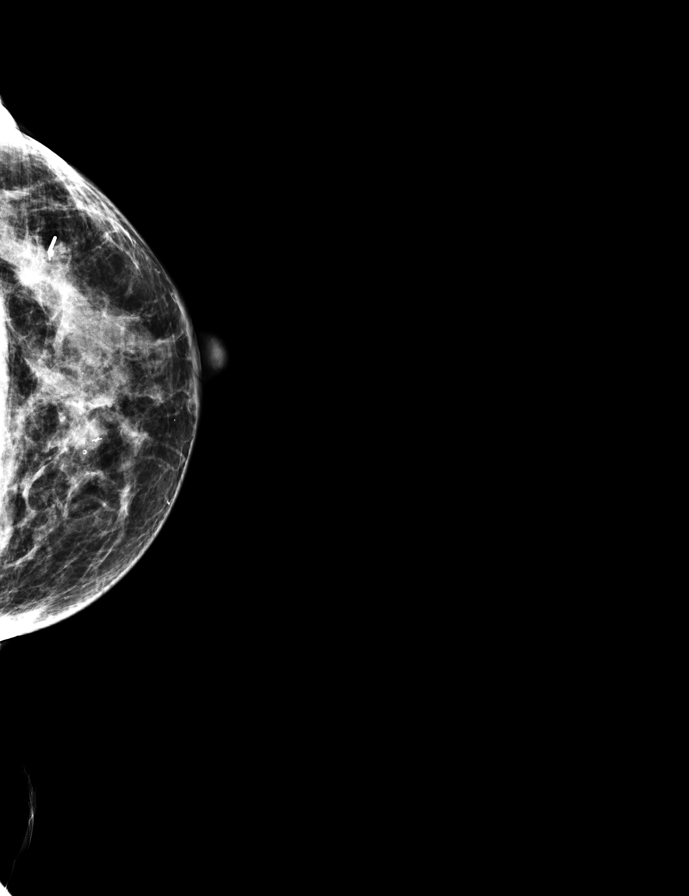

[L CC (2 of 4)]
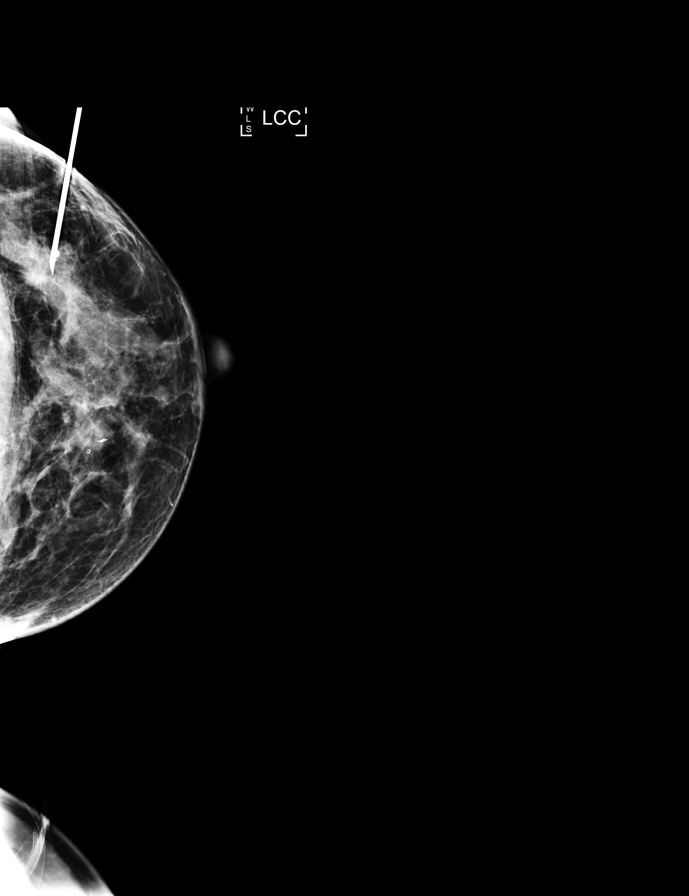

[L LM (1 of 4)]
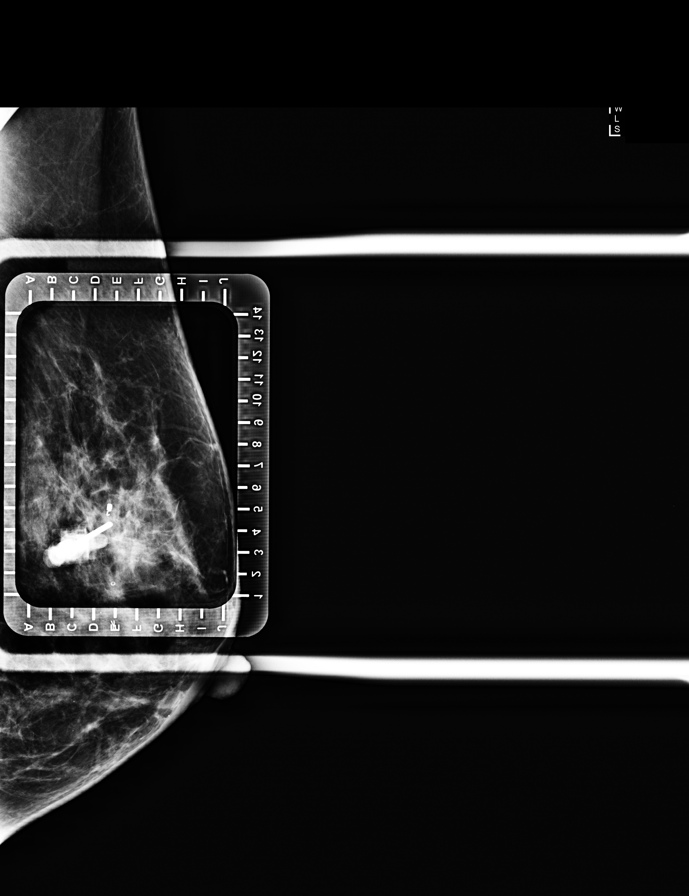

[L LM (2 of 4)]
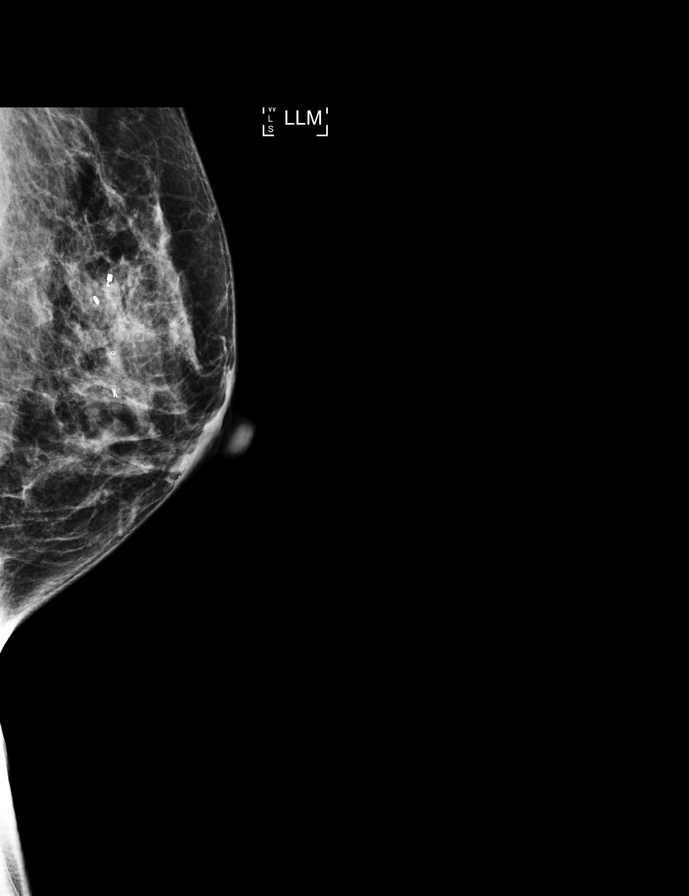

[L CC (3 of 4)]
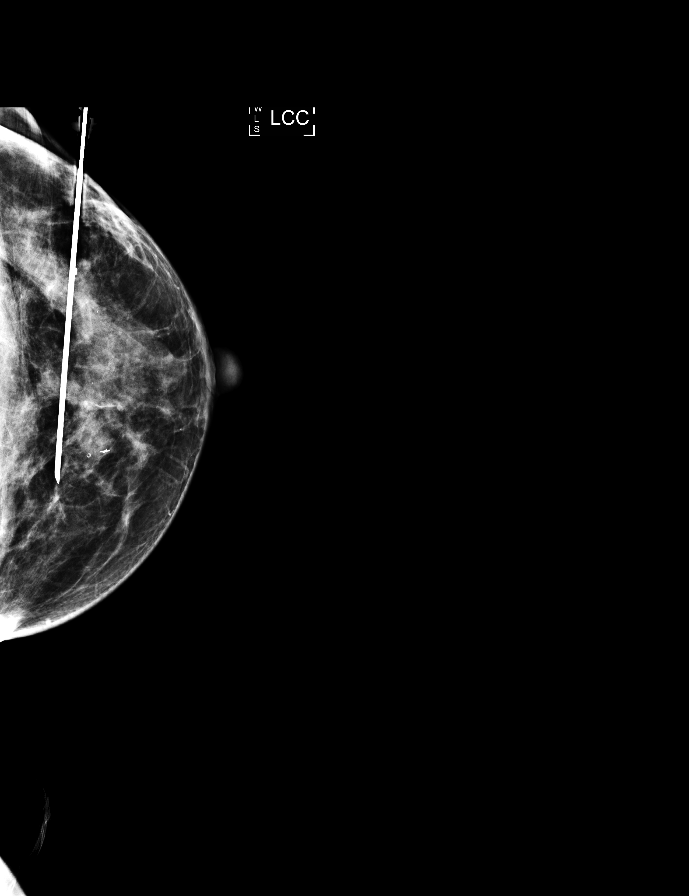

[L LM (3 of 4)]
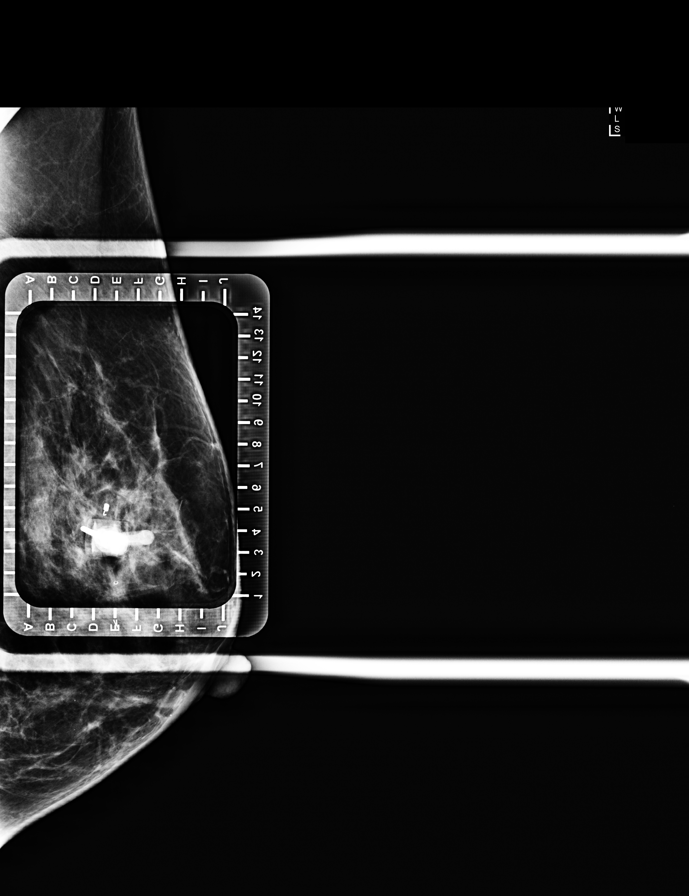

[L CC (4 of 4)]
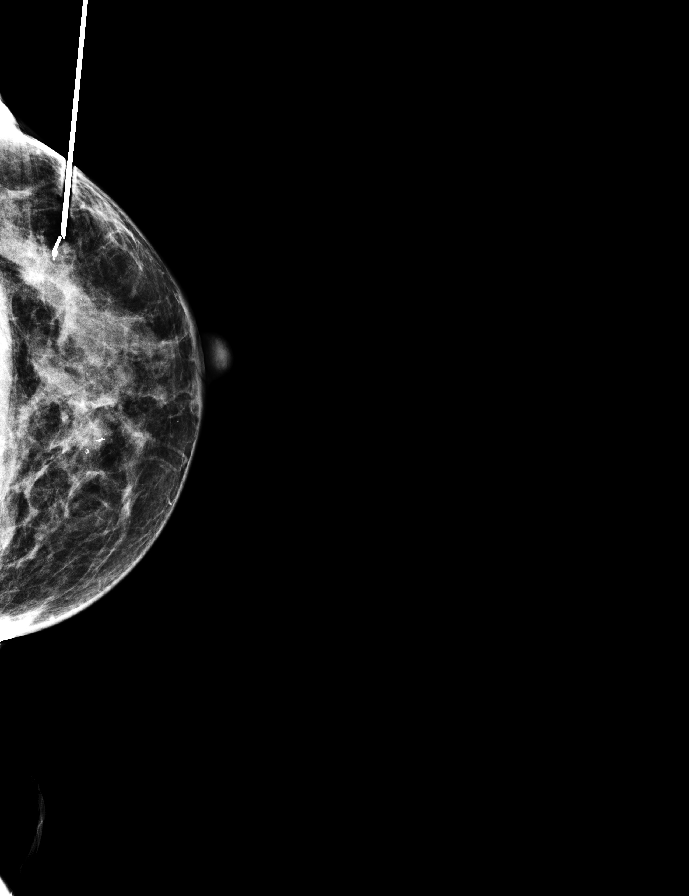

[L LM (4 of 4)]
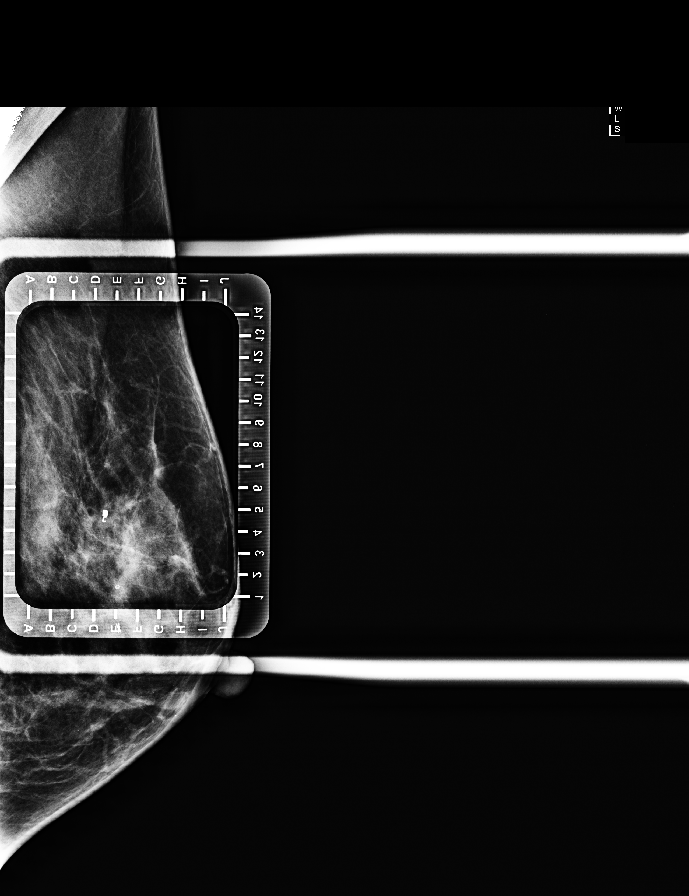

[8 of 8 positions shown; findings below may reference images not displayed]

FINDINGS: Patient presents for radioactive seed localization prior to LEFT
breast lumpectomy. I met with the patient and we discussed the
procedure of seed localization including benefits and alternatives.
We discussed the high likelihood of a successful procedure. We
discussed the risks of the procedure including infection, bleeding,
tissue injury and further surgery. We discussed the low dose of
radioactivity involved in the procedure. Informed, written consent
was given.

The usual time-out protocol was performed immediately prior to the
procedure.

Using mammographic guidance, sterile technique with chlorhexidine as
skin antisepsis, 1% lidocaine as local anesthetic, an S-XLG
radioactive seed was used to localize the coil shaped tissue marker
clip and the residual faint calcifications in the UPPER OUTER
QUADRANT of the LEFT breast using a LATERAL approach. The follow-up
mammogram images confirm the seed in the expected location
immediately adjacent to the coil shaped clip and the residual
calcifications. The images were marked for Dr. Jongmoo.

Follow-up survey of the patient confirms the presence of the
radioactive seed.

Order number of S-XLG seed: [id number]

Total activity:

Reference Date: 02/04/2019

The patient tolerated the procedure well and was released from the
[REDACTED]. She was given instructions regarding seed removal.
IMPRESSION: Radioactive seed localization of biopsy-proven high-grade DCIS
involving the UPPER OUTER QUADRANT of the LEFT breast. No apparent
complications.

## 2020-02-20 IMAGING — DX BREAST SURGICAL SPECIMEN
1 series · 2 of 2 positions shown · non-contrast
Comparison: Previous exam(s).

CLINICAL DATA: Left lumpectomy for recently diagnosed ductal
carcinoma in situ.

EXAM:
SPECIMEN RADIOGRAPH OF THE LEFT BREAST

[Series 2: specimen digital x-ray, derived · left · 2 of 2 slices shown]
[im 1/2]
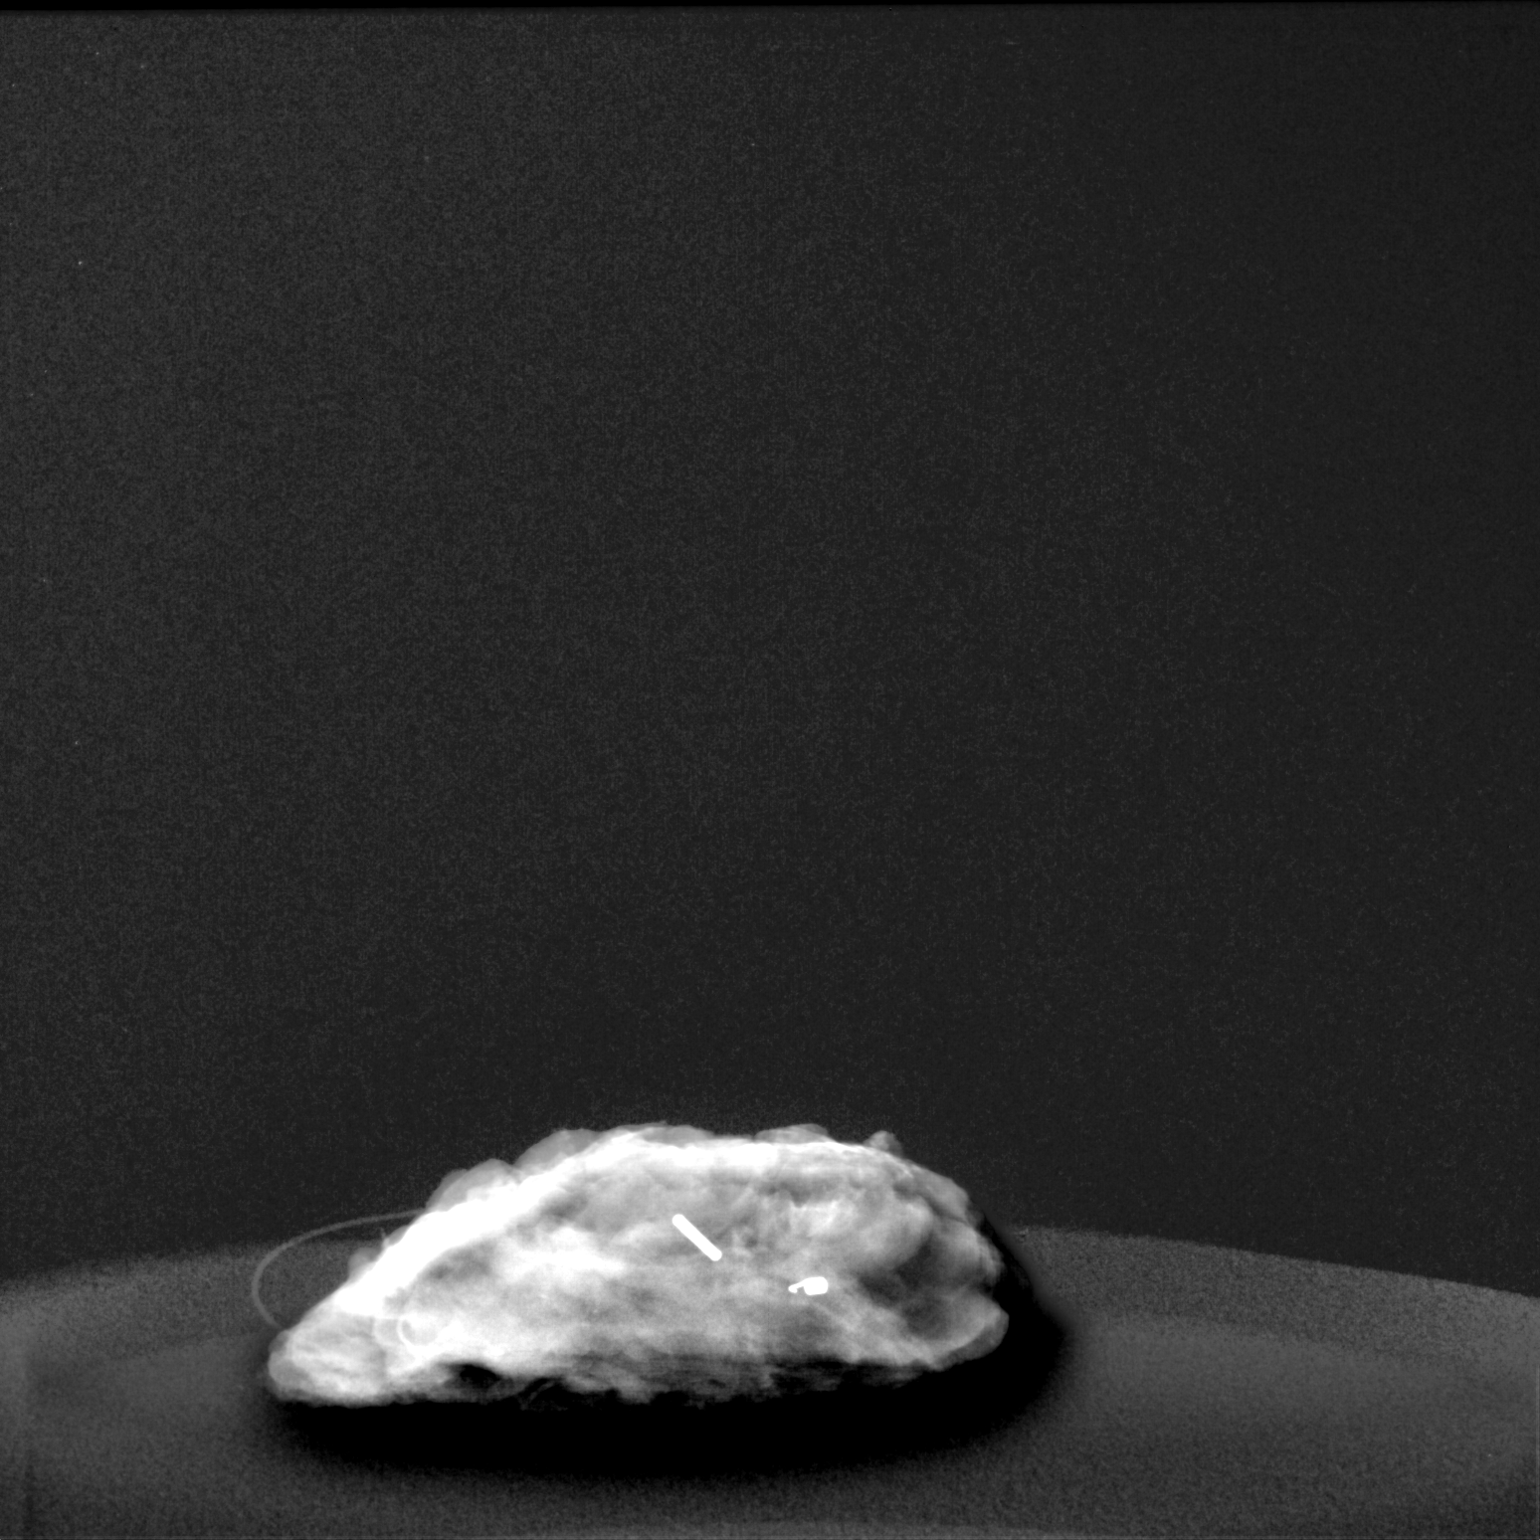
[im 2/2]
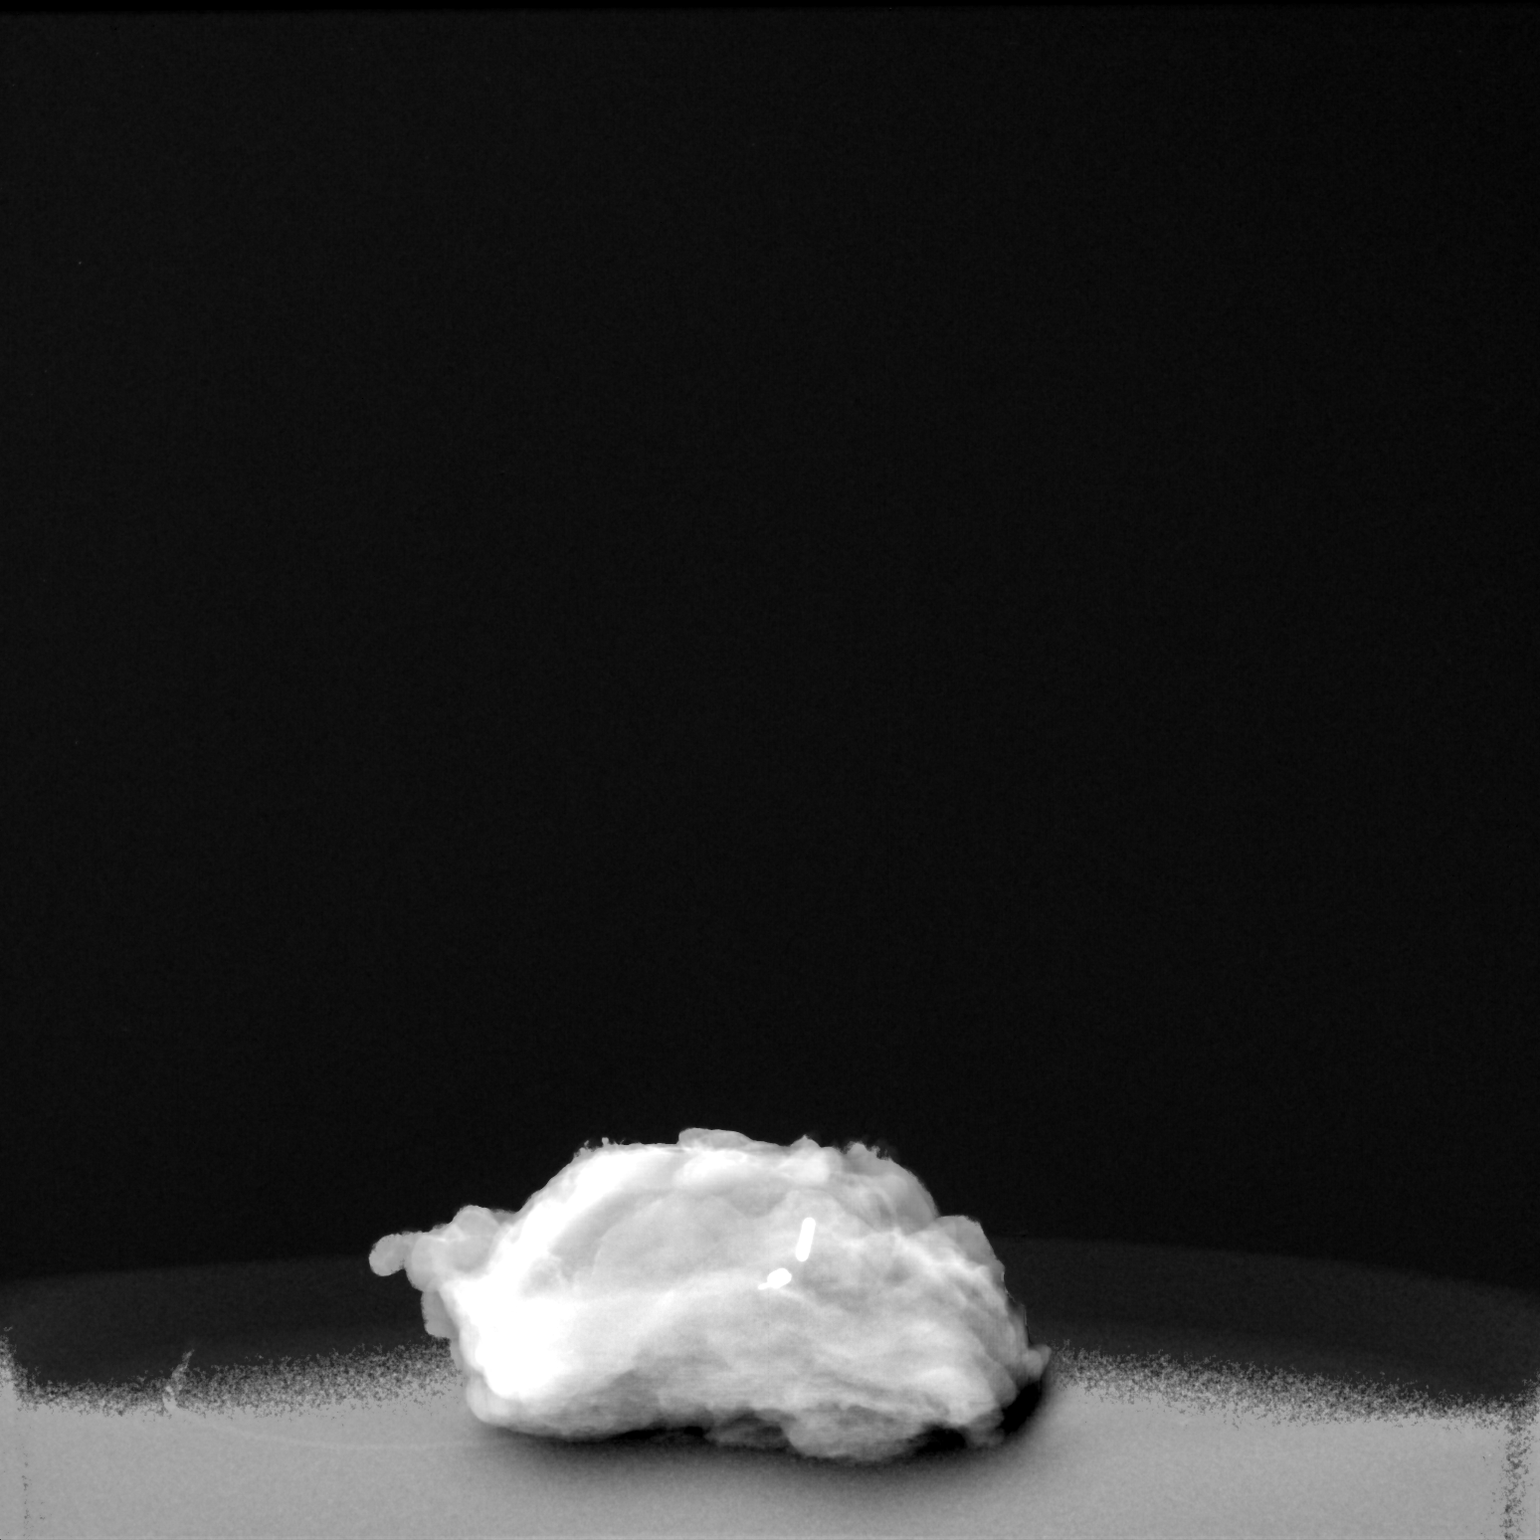

[2 of 2 positions shown; findings below may reference images not displayed]

FINDINGS: Status post excision of the left breast. The radioactive seed and
coil shaped biopsy marker clip are present and completely intact.
IMPRESSION: Specimen radiograph of the left breast.

## 2020-12-27 ENCOUNTER — Other Ambulatory Visit: Payer: Self-pay | Admitting: Adult Health

## 2020-12-27 DIAGNOSIS — Z853 Personal history of malignant neoplasm of breast: Secondary | ICD-10-CM

## 2021-02-02 ENCOUNTER — Other Ambulatory Visit: Payer: Self-pay

## 2021-02-02 ENCOUNTER — Ambulatory Visit
Admission: RE | Admit: 2021-02-02 | Discharge: 2021-02-02 | Disposition: A | Discharge status: Home / Self Care | Payer: BC Managed Care – PPO | Source: Ambulatory Visit | Attending: Adult Health | Admitting: Adult Health

## 2021-02-02 DIAGNOSIS — Z853 Personal history of malignant neoplasm of breast: Secondary | ICD-10-CM

## 2021-02-02 HISTORY — DX: Malignant (primary) neoplasm, unspecified: C80.1

## 2022-02-21 ENCOUNTER — Other Ambulatory Visit: Payer: Self-pay | Admitting: Obstetrics and Gynecology

## 2022-02-21 DIAGNOSIS — Z9889 Other specified postprocedural states: Secondary | ICD-10-CM

## 2022-03-14 ENCOUNTER — Ambulatory Visit
Admission: RE | Admit: 2022-03-14 | Discharge: 2022-03-14 | Disposition: A | Discharge status: Home / Self Care | Payer: BC Managed Care – PPO | Source: Ambulatory Visit | Attending: Obstetrics and Gynecology | Admitting: Obstetrics and Gynecology

## 2022-03-14 DIAGNOSIS — Z9889 Other specified postprocedural states: Secondary | ICD-10-CM

## 2023-02-12 ENCOUNTER — Other Ambulatory Visit: Payer: Self-pay | Admitting: Obstetrics and Gynecology

## 2023-02-12 DIAGNOSIS — Z1231 Encounter for screening mammogram for malignant neoplasm of breast: Secondary | ICD-10-CM

## 2023-03-27 ENCOUNTER — Ambulatory Visit
Admission: RE | Admit: 2023-03-27 | Discharge: 2023-03-27 | Disposition: A | Discharge status: Home / Self Care | Payer: BC Managed Care – PPO | Source: Ambulatory Visit | Attending: Obstetrics and Gynecology | Admitting: Obstetrics and Gynecology

## 2023-03-27 DIAGNOSIS — Z1231 Encounter for screening mammogram for malignant neoplasm of breast: Secondary | ICD-10-CM

## 2024-03-14 ENCOUNTER — Other Ambulatory Visit: Payer: Self-pay | Admitting: Obstetrics and Gynecology

## 2024-03-14 DIAGNOSIS — Z1231 Encounter for screening mammogram for malignant neoplasm of breast: Secondary | ICD-10-CM

## 2024-03-27 ENCOUNTER — Ambulatory Visit: Payer: Self-pay

## 2024-03-28 ENCOUNTER — Ambulatory Visit: Payer: Self-pay

## 2024-04-02 ENCOUNTER — Ambulatory Visit: Payer: Self-pay

## 2024-04-07 ENCOUNTER — Ambulatory Visit
Admission: RE | Admit: 2024-04-07 | Discharge: 2024-04-07 | Disposition: A | Discharge status: Home / Self Care | Payer: Self-pay | Source: Ambulatory Visit | Attending: Obstetrics and Gynecology | Admitting: Obstetrics and Gynecology

## 2024-04-07 DIAGNOSIS — Z1231 Encounter for screening mammogram for malignant neoplasm of breast: Secondary | ICD-10-CM
